# Patient Record
Sex: Female | Born: 1975 | Race: Black or African American | Hispanic: No | Smoking: Current some day smoker
Health system: Southern US, Community
[De-identification: ages and names within clinical notes are randomized; demographics above are authoritative.]

## PROBLEM LIST (undated history)

## (undated) DIAGNOSIS — F419 Anxiety disorder, unspecified: Secondary | ICD-10-CM

## (undated) DIAGNOSIS — F32A Depression, unspecified: Secondary | ICD-10-CM

## (undated) DIAGNOSIS — F329 Major depressive disorder, single episode, unspecified: Secondary | ICD-10-CM

## (undated) DIAGNOSIS — F909 Attention-deficit hyperactivity disorder, unspecified type: Secondary | ICD-10-CM

## (undated) DIAGNOSIS — E119 Type 2 diabetes mellitus without complications: Secondary | ICD-10-CM

## (undated) DIAGNOSIS — J45909 Unspecified asthma, uncomplicated: Secondary | ICD-10-CM

## (undated) DIAGNOSIS — D649 Anemia, unspecified: Secondary | ICD-10-CM

## (undated) DIAGNOSIS — K219 Gastro-esophageal reflux disease without esophagitis: Secondary | ICD-10-CM

## (undated) HISTORY — DX: Attention-deficit hyperactivity disorder, unspecified type: F90.9

## (undated) HISTORY — DX: Depression, unspecified: F32.A

## (undated) HISTORY — PX: DILATION AND CURETTAGE OF UTERUS: SHX78

## (undated) HISTORY — DX: Anemia, unspecified: D64.9

## (undated) HISTORY — DX: Anxiety disorder, unspecified: F41.9

## (undated) HISTORY — DX: Gastro-esophageal reflux disease without esophagitis: K21.9

## (undated) HISTORY — DX: Unspecified asthma, uncomplicated: J45.909

## (undated) HISTORY — DX: Type 2 diabetes mellitus without complications: E11.9

## (undated) HISTORY — DX: Major depressive disorder, single episode, unspecified: F32.9

---

## 2009-10-26 HISTORY — PX: KNEE ARTHROSCOPY: SUR90

## 2010-10-26 HISTORY — PX: GASTRIC BYPASS: SHX52

## 2011-09-25 ENCOUNTER — Emergency Department: Payer: Self-pay | Admitting: Emergency Medicine

## 2013-05-23 ENCOUNTER — Other Ambulatory Visit: Payer: Self-pay | Admitting: Bariatrics

## 2013-05-29 ENCOUNTER — Ambulatory Visit
Admission: RE | Admit: 2013-05-29 | Discharge: 2013-05-29 | Disposition: A | Discharge status: Home / Self Care | Payer: BC Managed Care – PPO | Source: Ambulatory Visit | Attending: Bariatrics | Admitting: Bariatrics

## 2013-06-22 ENCOUNTER — Ambulatory Visit: Payer: Self-pay | Admitting: Bariatrics

## 2013-06-26 ENCOUNTER — Ambulatory Visit: Payer: Self-pay | Admitting: Bariatrics

## 2013-07-26 ENCOUNTER — Ambulatory Visit: Payer: Self-pay | Admitting: Bariatrics

## 2014-07-17 ENCOUNTER — Emergency Department: Payer: Self-pay | Admitting: Emergency Medicine

## 2016-09-01 ENCOUNTER — Ambulatory Visit: Payer: BC Managed Care – PPO | Admitting: Certified Registered Nurse Anesthetist

## 2016-09-01 ENCOUNTER — Encounter
Admission: RE | Disposition: A | Discharge status: Home / Self Care | Payer: Self-pay | Source: Ambulatory Visit | Attending: Bariatrics

## 2016-09-01 ENCOUNTER — Ambulatory Visit
Admission: RE | Admit: 2016-09-01 | Discharge: 2016-09-01 | Disposition: A | Discharge status: Home / Self Care | Payer: BC Managed Care – PPO | Source: Ambulatory Visit | Attending: Bariatrics | Admitting: Bariatrics

## 2016-09-01 DIAGNOSIS — K219 Gastro-esophageal reflux disease without esophagitis: Secondary | ICD-10-CM | POA: Insufficient documentation

## 2016-09-01 DIAGNOSIS — E7800 Pure hypercholesterolemia, unspecified: Secondary | ICD-10-CM | POA: Diagnosis not present

## 2016-09-01 DIAGNOSIS — F419 Anxiety disorder, unspecified: Secondary | ICD-10-CM | POA: Insufficient documentation

## 2016-09-01 DIAGNOSIS — Z9049 Acquired absence of other specified parts of digestive tract: Secondary | ICD-10-CM | POA: Insufficient documentation

## 2016-09-01 DIAGNOSIS — E119 Type 2 diabetes mellitus without complications: Secondary | ICD-10-CM | POA: Insufficient documentation

## 2016-09-01 DIAGNOSIS — Z9884 Bariatric surgery status: Secondary | ICD-10-CM | POA: Diagnosis not present

## 2016-09-01 DIAGNOSIS — R1011 Right upper quadrant pain: Secondary | ICD-10-CM | POA: Insufficient documentation

## 2016-09-01 DIAGNOSIS — Z79899 Other long term (current) drug therapy: Secondary | ICD-10-CM | POA: Insufficient documentation

## 2016-09-01 DIAGNOSIS — R197 Diarrhea, unspecified: Secondary | ICD-10-CM | POA: Insufficient documentation

## 2016-09-01 DIAGNOSIS — Z87891 Personal history of nicotine dependence: Secondary | ICD-10-CM | POA: Insufficient documentation

## 2016-09-01 DIAGNOSIS — R1013 Epigastric pain: Secondary | ICD-10-CM | POA: Insufficient documentation

## 2016-09-01 DIAGNOSIS — G4730 Sleep apnea, unspecified: Secondary | ICD-10-CM | POA: Diagnosis not present

## 2016-09-01 DIAGNOSIS — F329 Major depressive disorder, single episode, unspecified: Secondary | ICD-10-CM | POA: Diagnosis not present

## 2016-09-01 DIAGNOSIS — Z6841 Body Mass Index (BMI) 40.0 and over, adult: Secondary | ICD-10-CM | POA: Insufficient documentation

## 2016-09-01 HISTORY — PX: ESOPHAGOGASTRODUODENOSCOPY (EGD) WITH PROPOFOL: SHX5813

## 2016-09-01 LAB — POCT PREGNANCY, URINE: Preg Test, Ur: NEGATIVE

## 2016-09-01 SURGERY — ESOPHAGOGASTRODUODENOSCOPY (EGD) WITH PROPOFOL
Anesthesia: General

## 2016-09-01 MED ORDER — FENTANYL CITRATE (PF) 100 MCG/2ML IJ SOLN
INTRAMUSCULAR | Status: DC | PRN
  Administered 2016-09-01: 50 ug via INTRAVENOUS

## 2016-09-01 MED ORDER — OMEPRAZOLE 20 MG PO CPDR: 20.0000 mg | DELAYED_RELEASE_CAPSULE | Freq: Every day | ORAL | 1 refills | Status: DC

## 2016-09-01 MED ORDER — SODIUM CHLORIDE 0.9 % IV SOLN
INTRAVENOUS | Status: DC
  Administered 2016-09-01: 13:00:00 via INTRAVENOUS

## 2016-09-01 MED ORDER — IPRATROPIUM-ALBUTEROL 0.5-2.5 (3) MG/3ML IN SOLN
3.0000 mL | Freq: Four times a day (QID) | RESPIRATORY_TRACT | Status: DC
  Administered 2016-09-01: 3 mL via RESPIRATORY_TRACT
  Filled 2016-09-01: qty 3

## 2016-09-01 MED ORDER — PROPOFOL 500 MG/50ML IV EMUL
INTRAVENOUS | Status: DC | PRN
  Administered 2016-09-01: 150 ug/kg/min via INTRAVENOUS

## 2016-09-01 MED ORDER — LIDOCAINE HCL (CARDIAC) 20 MG/ML IV SOLN
INTRAVENOUS | Status: DC | PRN
  Administered 2016-09-01: 100 mg via INTRAVENOUS

## 2016-09-01 MED ORDER — PROPOFOL 10 MG/ML IV BOLUS
INTRAVENOUS | Status: DC | PRN
Start: 2016-09-01 — End: 2016-09-01
  Administered 2016-09-01: 80 mg via INTRAVENOUS
  Administered 2016-09-01 (×4): 20 mg via INTRAVENOUS

## 2016-09-01 MED ORDER — IPRATROPIUM-ALBUTEROL 0.5-2.5 (3) MG/3ML IN SOLN: 3.0000 mL | Freq: Four times a day (QID) | RESPIRATORY_TRACT | Status: DC

## 2016-09-01 MED ORDER — IPRATROPIUM-ALBUTEROL 0.5-2.5 (3) MG/3ML IN SOLN
RESPIRATORY_TRACT | Status: AC
Start: 2016-09-01 — End: 2016-09-01
  Administered 2016-09-01: 15:00:00 via RESPIRATORY_TRACT
  Filled 2016-09-01: qty 3

## 2016-09-01 NOTE — Transfer of Care (Signed)
Immediate Anesthesia Transfer of Care Note  Patient: [name] Potts  Procedure(s) Performed: Procedure(s): ESOPHAGOGASTRODUODENOSCOPY (EGD) WITH PROPOFOL (N/A)  Patient Location: PACU  Anesthesia Type:General  Level of Consciousness: awake, alert  and oriented  Airway & Oxygen Therapy: Patient Spontanous Breathing and Patient connected to face mask oxygen  Post-op Assessment: Report given to RN and Post -op Vital signs reviewed and stable  Post vital signs: Reviewed and stable  Last Vitals:  Vitals:   09/01/16 1304  BP: (!) 149/99  Pulse: 72  Resp: 17  Temp: 36.1 C    Last Pain:  Vitals:   09/01/16 1304  TempSrc: Tympanic         Complications: No apparent anesthesia complications

## 2016-09-01 NOTE — Anesthesia Postprocedure Evaluation (Signed)
Anesthesia Post Note  Patient: [name] Potts  Procedure(s) Performed: Procedure(s) (LRB): ESOPHAGOGASTRODUODENOSCOPY (EGD) WITH PROPOFOL (N/A)  Patient location during evaluation: PACU Anesthesia Type: General Level of consciousness: awake Pain management: pain level controlled Vital Signs Assessment: post-procedure vital signs reviewed and stable Respiratory status: spontaneous breathing Cardiovascular status: stable Anesthetic complications: no    Last Vitals:  Vitals:   09/01/16 1400 09/01/16 1410  BP: 127/89 (!) 136/92  Pulse:  65  Resp:    Temp:      Last Pain:  Vitals:   09/01/16 1345  TempSrc: Oral                 VAN STAVEREN,Cerina Leary

## 2016-09-01 NOTE — Interval H&P Note (Signed)
History and Physical Interval Note:  09/01/2016 1:08 PM  [name] Potts  has presented today for surgery, with the diagnosis of GASTRIC REFLUX DISEASE  The various methods of treatment have been discussed with the patient and family. After consideration of risks, benefits and other options for treatment, the patient has consented to  Procedure(s): ESOPHAGOGASTRODUODENOSCOPY (EGD) WITH PROPOFOL (N/A) as a surgical intervention .  The patient's history has been reviewed, patient examined, no change in status, stable for surgery.  I have reviewed the patient's chart and labs.  Questions were answered to the patient's satisfaction.     Everette Rankyner, Laurelyn Terrero A

## 2016-09-01 NOTE — H&P (Signed)
Print     Patient   Name  [name] Potts, [name] (64YQ(40yo, F) ID# [id number]  Appt. Date/Time  08/19/2016 11:40AM   DOB  [birth year]  Service Dept.  [facility]   Provider  [name] SandyKAYLA CHECKOVICH, PA   Insurance  Med Primary: [employer] Insurance # : [id number] Policy/Group # : [id number]  Prescription: CMX - Member is eligible. details   Chief Complaint  EGD Admin Visit  Patient's Care Team  Primary Care Provider: Craig StaggersSABITHA VASIREDDY MD: [address], [place], [place] [id number], Ph [phone], Fax [phone] NPI: [id number] Patient's Pharmacies  [facility] [phone] [address], [place] [phone], Ph [phone], Fax [phone] Vitals  08/19/2016 12:08 pm Ht:  5 ft 2 in  Wt:  278 lbs   BMI:  50.8   BP:  138/88 sitting L wrist  Pulse:  69 bpm      Allergies   Reviewed Allergies   NKDA   Medications   Reviewed Medications       cholecalciferol (vitamin D3)  04/12/13 entered  [name] Shyyner Camilo Mander, MD   colestipol 1 gram tablet  TAKE 2 TABLET(S) TWICE A DAY BY ORAL ROUTE AS NEEDED FOR 15 DAYS.  07/29/16 filled  surescripts   Naprelan CR 750 mg tab,extended release 24 hr mphase  10/27/13 filled  [facility]   omeprazole 20 mg capsule,delayed release  TAKE 1 CAPSULE (20 MG) BY ORAL ROUTE ONCE DAILY 30 MINUTES TO 1 HOUR BEFORE A MEAL  06/15/13 filled  [facility]   omeprazole 20 mg tablet,delayed release  Take 1 tablet(s) twice a day by oral route as directed.  03/28/14 prescribed  [name] Shyyner Dilyn Osoria, MD   phentermine 37.5 mg tablet  TAKE 1 TABLET(S) EVERY DAY BY ORAL ROUTE AS DIRECTED.  07/29/16 filled  surescripts   traZODone 100 mg tablet  03/22/14 filled  [facility]   traZODone 50 mg tablet  05/25/13 filled  [facility]   valACYclovir 1 gram tablet  03/19/14 filled  [facility]   valACYclovir 500 mg tablet  05/11/13 filled  [facility]   Zofran ODT 4 mg disintegrating tablet  Take 2 tablet(s) every 12 hours by oral route as needed.  03/28/14 prescribed  [name] Shyyner Yonatan Guitron, MD   Problems  Reviewed  Problems Morbid obesity  Gastroesophageal reflux disease  Diarrhea  Right upper quadrant pain  History of bariatric surgical procedure - Onset: 07/29/2016  Family History  Reviewed Family History Mother  - Arthritis    - Hypercholesterolemia    - Diabetes mellitus    - Hypertensive disorder    - Malignant neoplastic disease   Father  - Heart disease    - Reflux    - Diabetes mellitus    - Hypertensive disorder    - Cerebrovascular accident   Social History  Reviewed Social History General Surgery/Bariatric Smoking Status: Former smoker (Notes: Noted 08/19/16 visit:No longer smoking) Chewing Tobacco Use: N Illicit drugs: no Caffeine: Y Alcohol: Y (Notes: Noted 1025/17 visit:a beer a day) Exercise: Y  Surgical History  Surgical History not reviewed (last reviewed 07/29/2016) Other - 02/07/2014 - EGD with dil  Cholecystectomy - 01/25/2011  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 - [facility] [place]  Knee Surgery - 2011  GYN History  (not configured)  Past Medical History  Reviewed Past Medical History Abdominal Pain: Y Anemia: Y Anxiety Disorder: Y Asthma: Y Blood Clots: Y Blood in Stool: Y Breast Discharge: Y Chest Pain/Angina: Y Constipation: Y Cough: Y Depression: Y Diabetes: Y Diarrhea: Y Dizziness: Y Eating Disorder:  Y Excessive Thirst: Y Fatigue: Y Fever: Y Frequent Urination: Y Gall Stones: Y Headaches: Y Heartburn: Y High Cholesterol: Y Joint Pain: Y Joint Stiffness: Y Muscle Weakness: Y Nausea/ Vomiting: Y Nervousness: Y Nosebleeds: Y Painful Bowel Movements: Y Pneumonia: Y Poor Appetite: Y Rectal Bleeding: Y Ringing in Ears: Y Sleep apnea: Y Snoring: Y Sore Throat: Y Spitting up Blood: Y Swelling of Legs/Feet/Hands: Y Swollen Glands: Y Weight Gain: Y Weight loss: Y Notes: gerd  Screening  None recorded.  HPI  Pt presents today for administrative visit for EGD on 11/7. States that there have been no changes to her medical  history or medications. Does take Ibuprofen for menstrual relief which she is supposed to start on 10/28 and end on 11/1. Denies the use of aspirin, Vitamin E, fish oil and blood thinners. No complaints or concerns at this time.  ROS   Constitutional: Constitutional: no significant weight gain or loss, no fever, and (normal) chills.  Eyes: Eyes: no irritation, dry eyes, or vision change and (normal) blurred vision and seeing double (diplopia).  ENMT: Ears: no difficulty hearing or ear pain and (normal) ringing in the ears (tinnitus). Nose: no frequent nosebleeds or nose/sinus problems. Mouth/Throat: no sore throat or mouth ulcers and (normal) swollen glands.  Cardiovascular: Cardiovascular: no palpitations or chest pain and (normal) limb swelling.  Respiratory: Respiratory: no cough, wheezing, shortness of breath, or coughing up blood.  Gastrointestinal: Gastrointestinal: vomiting [name] Potts(/Nausea) and frequent diarrhea and no abdominal pain, not vomiting blood, and (normal) color: red blood in bowel movement (hematochezia); Constipation.  Genitourinary: Genitourinary: no incontinence, hematuria, difficulty urinating, or increased frequency.  Musculoskeletal: Musculoskeletal: no muscle aches or weakness and arthralgias/joint pain; Joint Swelling.  Integumentary: Skin: no jaundice, rashes, or abnormal mole and (normal) breast pain.  Neurologic: Neurologic: no weakness, numbness, seizures, dizziness, headaches, or loss of consciousness.  Psychiatric: Psych: no depression or sleep disturbances.  Endocrine: Endocrine: no fatigue and (normal) dry skin.  Hematologic/Lymphatic: Hematologic/Lymphatic no bruising or blood clot.  Allergic/Immunologic: Allergy/Immunologic: no itching, hives, runny nose, sinus pressure, or frequent sneezing.  Physical Exam   Patient is a 40 year old female.  Constitutional: Level of Distress: NAD. Ambulation: ambulating normally.  Psychiatric: Insight: good judgement. Mental  Status: normal mood and affect and active and alert. Orientation: to time, place, and person. Memory: recent memory normal and remote memory normal.  Head: Head: normocephalic and atraumatic.  Eyes: Lids and Conjunctivae: no discharge or pallor and non-injected. EOM: EOMI. Sclerae: non-icteric.  Neck: Neck: supple, FROM, trachea midline, and no masses. Lymph Nodes: no cervical LAD or supraclavicular LAD.  Lungs: Respiratory effort: no dyspnea. Auscultation: no wheezing, rales/crackles, or rhonchi and breath sounds normal, good air movement, and CTA except as noted.  Cardiovascular: Heart Auscultation: normal S1 and S2; no murmurs, rubs, or gallops; and RRR. Neck vessels: no carotid bruits. Pulses including femoral / pedal: normal throughout.  Abdomen: Inspection and Palpation: no tenderness, guarding, masses, or rebound tenderness and soft and non-distended.  Musculoskeletal:: Motor Strength and Tone: normal tone and motor strength. Joints, Bones, and Muscles: normal movement of all extremities. Extremities: no cyanosis or edema.  Neurologic: Gait and Station: normal gait and station. Cranial Nerves: grossly intact.  Skin: Inspection and palpation: good turgor and no jaundice. Nails: normal.  Procedure Documentation  None recorded.  Assessment / Plan   ASSESSMENT: 1. Administrative visit for EGD on 11/7 PLAN:  1. Stop taking NSAIDs after 10/31. Do not take aspirin, Vitamin E, fish oil or blood thinners  after 10/31 as well. 2. Remain NPO after midnight on 11/6  3. May take Tylenol for pain  4. If there are any changes to your medical history or medications, please call and update the office 5. F/U as scheduled after procedure

## 2016-09-01 NOTE — Anesthesia Preprocedure Evaluation (Signed)
Anesthesia Evaluation  Patient identified by MRN, date of birth, ID band Patient awake    Reviewed: Allergy & Precautions, NPO status , Patient's Chart, lab work & pertinent test results  Airway Mallampati: II       Dental  (+) Teeth Intact   Pulmonary neg pulmonary ROS,     + decreased breath sounds      Cardiovascular Exercise Tolerance: Good  Rhythm:Regular Rate:Normal     Neuro/Psych negative neurological ROS     GI/Hepatic negative GI ROS, Neg liver ROS,   Endo/Other  Morbid obesity  Renal/GU negative Renal ROS     Musculoskeletal   Abdominal (+) + obese,   Peds negative pediatric ROS (+)  Hematology negative hematology ROS (+)   Anesthesia Other Findings   Reproductive/Obstetrics                             Anesthesia Physical Anesthesia Plan  ASA: III  Anesthesia Plan: General   Post-op Pain Management:    Induction: Intravenous  Airway Management Planned: Natural Airway and Nasal Cannula  Additional Equipment:   Intra-op Plan:   Post-operative Plan:   Informed Consent: I have reviewed the patients History and Physical, chart, labs and discussed the procedure including the risks, benefits and alternatives for the proposed anesthesia with the patient or authorized representative who has indicated his/her understanding and acceptance.     Plan Discussed with: CRNA  Anesthesia Plan Comments:         Anesthesia Quick Evaluation

## 2016-09-01 NOTE — Op Note (Addendum)
[facility] Gastroenterology Patient Name: [name] RocherGabrielle Pendleton Procedure Date: 09/01/2016 1:21 PM MRN: [id number] Account #: [id number] Date of Birth: 1975/12/01 Admit Type: Outpatient Age: 40 [facility]OM 4 Gender: Female Note Status: Finalized Procedure:            Upper GI endoscopy Indications:          Epigastric abdominal pain, Abdominal pain in the right                        upper quadrant Providers:            Tyrone AppleMichael A. Jakelin Taussig Referring MD:         No Local Md, MD (Referring MD) Medicines:            Sedation Required Anesthesia Staff Assistance Complications:        No immediate complications. Procedure:            Pre-Anesthesia Assessment:                       - The anesthesia plan was to use moderate                        sedation/analgesia (conscious sedation).                       After obtaining informed consent, the endoscope was                        passed under direct vision. Throughout the procedure,                        the patient's blood pressure, pulse, and oxygen                        saturations were monitored continuously. The upper GI                        endoscopy was accomplished without difficulty. The                        patient tolerated the procedure well. The Endoscope was                        introduced through the mouth, and advanced to the                        second part of duodenum. Findings:      The ampulla, duodenal bulb, first portion of the duodenum and second       portion of the duodenum were normal.      tubular nature of sleeve gastric stapling. mild to moderate angulation       at incisura requiring some active turning of scope. A TTS dilator was       passed through the scope. Dilation with an 18-19-20 mm balloon dilator       was performed to 20 mm. + pooling of bile in stomach but only trace       gastritis.      The examined esophagus was normal. no Hiatal hernia noted. Impression:            - Normal ampulla, duodenal bulb, first  portion of the                        duodenum and second portion of the duodenum.                       - Normal esophagus.                       - No specimens collected. Recommendation:       - Return to my office in 2 weeks. Procedure Code(s):    --- Professional ---                       [phone], Esophagogastroduodenoscopy, flexible, transoral;                        with dilation of gastric/duodenal stricture(s) (eg,                        balloon, bougie) Diagnosis Code(s):    --- Professional ---                       R10.13, Epigastric pain                       R10.11, Right upper quadrant pain CPT copyright 2016 American Medical Association. All rights reserved. The codes documented in this report are preliminary and upon coder review may  be revised to meet current compliance requirements. Effie ShyMichael Kizzie Cotten, MD Everette RankMichael A Jeffifer Rabold,  09/01/2016 2:19:33 PM This report has been signed electronically. Number of Addenda: 0 Note Initiated On: 09/01/2016 1:21 PM      [facility]

## 2016-09-01 NOTE — Brief Op Note (Signed)
09/01/2016  2:20 PM  PATIENT:  [name] Potts  40 y.o. female  PRE-OPERATIVE DIAGNOSIS:  GASTRIC REFLUX DISEASE  POST-OPERATIVE DIAGNOSIS:  mild to moderate acute angulation at area of the incisura, mild bile pooling in stomach, no evidence of esophagitis and normal proximal duodenum  PROCEDURE:  Procedure(s): ESOPHAGOGASTRODUODENOSCOPY (EGD) WITH PROPOFOL (N/A)WITH BALLOON DILATION  SURGEON:  Surgeon(s) and Role:    * Everette RankMichael A Jenah Vanasten, MD - Primary      ANESTHESIA:   MAC  EBL:  Total I/O In: 300 [I.V.:300] Out: -   BLOOD ADMINISTERED:none  DRAINS: none   LOCAL MEDICATIONS USED:  NONE  SPECIMEN:  No Specimen  DISPOSITION OF SPECIMEN:  N/A  COUNTS:  NO None required  TOURNIQUET:  * No tourniquets in log *  DICTATION: .Dragon Dictation  PLAN OF CARE: Discharge to home after PACU  PATIENT DISPOSITION:  Home   Delay start of Pharmacological VTE agent (>24hrs) due to surgical blood loss or risk of bleeding: no

## 2016-09-02 ENCOUNTER — Encounter: Payer: Self-pay | Admitting: Bariatrics

## 2018-01-03 ENCOUNTER — Other Ambulatory Visit: Payer: Self-pay

## 2018-01-03 DIAGNOSIS — F17200 Nicotine dependence, unspecified, uncomplicated: Secondary | ICD-10-CM | POA: Diagnosis not present

## 2018-01-03 DIAGNOSIS — Z79899 Other long term (current) drug therapy: Secondary | ICD-10-CM | POA: Diagnosis not present

## 2018-01-03 DIAGNOSIS — R1010 Upper abdominal pain, unspecified: Secondary | ICD-10-CM | POA: Insufficient documentation

## 2018-01-03 DIAGNOSIS — R1110 Vomiting, unspecified: Secondary | ICD-10-CM | POA: Diagnosis not present

## 2018-01-03 NOTE — ED Triage Notes (Signed)
Pt presents to ED via POV from home with c/o RIGHT side flank pain x2 months with increased intensity over the last 1-2 days. Pt reports increase in urinary frequency, and reports 2 episodes of emesis over the last 24 hrs.

## 2018-01-04 ENCOUNTER — Emergency Department
Admission: EM | Admit: 2018-01-04 | Discharge: 2018-01-04 | Disposition: A | Discharge status: Home / Self Care | Payer: 59 | Attending: Emergency Medicine | Admitting: Emergency Medicine

## 2018-01-04 ENCOUNTER — Emergency Department: Payer: 59

## 2018-01-04 DIAGNOSIS — R109 Unspecified abdominal pain: Secondary | ICD-10-CM

## 2018-01-04 LAB — CBC
HCT: 36.9 % (ref 35.0–47.0)
Hemoglobin: 11.9 g/dL — ABNORMAL LOW (ref 12.0–16.0)
MCH: 28.5 pg (ref 26.0–34.0)
MCHC: 32.3 g/dL (ref 32.0–36.0)
MCV: 88.5 fL (ref 80.0–100.0)
PLATELETS: 250 10*3/uL (ref 150–440)
RBC: 4.17 MIL/uL (ref 3.80–5.20)
RDW: 13.4 % (ref 11.5–14.5)
WBC: 8.8 10*3/uL (ref 3.6–11.0)

## 2018-01-04 LAB — URINALYSIS, COMPLETE (UACMP) WITH MICROSCOPIC
Bacteria, UA: NONE SEEN
Bilirubin Urine: NEGATIVE
Glucose, UA: NEGATIVE mg/dL
Hgb urine dipstick: NEGATIVE
Ketones, ur: NEGATIVE mg/dL
LEUKOCYTES UA: NEGATIVE
Nitrite: NEGATIVE
PROTEIN: NEGATIVE mg/dL
Specific Gravity, Urine: 1.004 — ABNORMAL LOW (ref 1.005–1.030)
pH: 6 (ref 5.0–8.0)

## 2018-01-04 LAB — COMPREHENSIVE METABOLIC PANEL
ALT: 33 U/L (ref 14–54)
AST: 78 U/L — ABNORMAL HIGH (ref 15–41)
Albumin: 3.8 g/dL (ref 3.5–5.0)
Alkaline Phosphatase: 73 U/L (ref 38–126)
Anion gap: 10 (ref 5–15)
BUN: 12 mg/dL (ref 6–20)
CHLORIDE: 105 mmol/L (ref 101–111)
CO2: 21 mmol/L — ABNORMAL LOW (ref 22–32)
CREATININE: 0.9 mg/dL (ref 0.44–1.00)
Calcium: 8.9 mg/dL (ref 8.9–10.3)
Glucose, Bld: 99 mg/dL (ref 65–99)
POTASSIUM: 3.6 mmol/L (ref 3.5–5.1)
Sodium: 136 mmol/L (ref 135–145)
Total Bilirubin: 1 mg/dL (ref 0.3–1.2)
Total Protein: 7.9 g/dL (ref 6.5–8.1)

## 2018-01-04 LAB — LIPASE, BLOOD: LIPASE: 46 U/L (ref 11–51)

## 2018-01-04 LAB — PREGNANCY, URINE: PREG TEST UR: NEGATIVE

## 2018-01-04 MED ORDER — ONDANSETRON HCL 4 MG/2ML IJ SOLN
4.0000 mg | Freq: Once | INTRAMUSCULAR | Status: AC
  Administered 2018-01-04: 4 mg via INTRAVENOUS
  Filled 2018-01-04: qty 2

## 2018-01-04 MED ORDER — IOPAMIDOL (ISOVUE-300) INJECTION 61%
125.0000 mL | Freq: Once | INTRAVENOUS | Status: AC | PRN
  Administered 2018-01-04: 125 mL via INTRAVENOUS

## 2018-01-04 MED ORDER — IOPAMIDOL (ISOVUE-300) INJECTION 61%
30.0000 mL | Freq: Once | INTRAVENOUS | Status: AC
  Administered 2018-01-04: 30 mL via ORAL

## 2018-01-04 MED ORDER — OXYCODONE-ACETAMINOPHEN 7.5-325 MG PO TABS: 1.0000 | ORAL_TABLET | ORAL | 0 refills | Status: AC | PRN

## 2018-01-04 MED ORDER — MORPHINE SULFATE (PF) 4 MG/ML IV SOLN
4.0000 mg | Freq: Once | INTRAVENOUS | Status: AC
  Administered 2018-01-04: 4 mg via INTRAVENOUS
  Filled 2018-01-04: qty 1

## 2018-01-04 MED ORDER — MORPHINE SULFATE (PF) 4 MG/ML IV SOLN
4.0000 mg | Freq: Once | INTRAVENOUS | Status: AC
Start: 2018-01-04 — End: 2018-01-04
  Administered 2018-01-04: 4 mg via INTRAVENOUS

## 2018-01-04 MED ORDER — HYDROMORPHONE HCL 1 MG/ML IJ SOLN
1.0000 mg | Freq: Once | INTRAMUSCULAR | Status: AC
  Administered 2018-01-04: 1 mg via INTRAVENOUS
  Filled 2018-01-04: qty 1

## 2018-01-04 MED ORDER — MORPHINE SULFATE (PF) 4 MG/ML IV SOLN
INTRAVENOUS | Status: AC
  Filled 2018-01-04: qty 1

## 2018-01-04 NOTE — ED Provider Notes (Signed)
Essentially normal CT scan without any specific etiology for her flank pain.  Labs and workup are unremarkable.  I will discharge her with pain medicine and advise close outpatient follow-up with her gastric bypass surgeon.   [name] Potts, [name] Potts E, MD 01/04/18 [phone]

## 2018-01-04 NOTE — ED Notes (Signed)
Pt ambulated to the bathroom and returned to her bed without difficulty.  

## 2018-01-04 NOTE — ED Provider Notes (Signed)
[facility] Emergency Department Provider Note    First MD Initiated Contact with Patient 01/04/18 0423     (approximate)  I have reviewed the triage vital signs and the nursing notes.   HISTORY  Chief Complaint Flank Pain    HPI [name] Potts is a 42 y.o. female presents to the emergency department with a 39-month history of upper abdominal pain with increasing intensity over the last 2 days.  Patient also admits to 2 episodes of emesis in the last 24 hours.  Patient denies any diarrhea no constipation.  Patient denies any fever.  Patient denies any urinary symptoms.  Patient states her current pain score 7.5-8 of 10   Past medical history Gastric sleeve  There are no active problems to display for this patient.   Past Surgical History:  Procedure Laterality Date  . ESOPHAGOGASTRODUODENOSCOPY (EGD) WITH PROPOFOL N/A 09/01/2016   Procedure: ESOPHAGOGASTRODUODENOSCOPY (EGD) WITH PROPOFOL;  Surgeon: Everette Rank, MD;  Location: [facility] ENDOSCOPY;  Service: General;  Laterality: N/A;    Prior to Admission medications   Medication Sig Start Date End Date Taking? Authorizing Provider  trazodone (DESYREL) 300 MG tablet Take 300 mg by mouth at bedtime.   Yes [provider]  omeprazole (PRILOSEC) 20 MG capsule Take 1 capsule (20 mg total) by mouth daily. Patient not taking: Reported on 01/04/2018 09/01/16   Everette Rank, MD  oxyCODONE-acetaminophen (PERCOCET) 7.5-325 MG tablet Take 1 tablet by mouth every 4 (four) hours as needed for severe pain. 01/04/18 01/04/19  Emily Filbert, MD    Allergies No known drug allergies No family history on file.  Social History Social History   Tobacco Use  . Smoking status: Current Every Day Smoker  . Smokeless tobacco: Never Used  Substance Use Topics  . Alcohol use: Not on file  . Drug use: Not on file    Review of Systems Constitutional: No fever/chills Eyes: No visual  changes. ENT: No sore throat. Cardiovascular: Denies chest pain. Respiratory: Denies shortness of breath. Gastrointestinal: Positive for abdominal pain nausea and vomiting no diarrhea.  No constipation. Genitourinary: Negative for dysuria. Musculoskeletal: Negative for neck pain.  Negative for back pain. Integumentary: Negative for rash. Neurological: Negative for headaches, focal weakness or numbness.   ____________________________________________   PHYSICAL EXAM:  VITAL SIGNS: ED Triage Vitals  Enc Vitals Group     BP 01/03/18 2329 (!) 165/95     Pulse Rate 01/03/18 2329 67     Resp 01/03/18 2329 18     Temp 01/03/18 2329 97.8 F (36.6 C)     Temp Source 01/03/18 2329 Oral     SpO2 01/03/18 2329 100 %     Weight 01/03/18 2330 126.1 kg (278 lb)     Height 01/03/18 2330 1.651 m (5\' 5" )     Head Circumference --      Peak Flow --      Pain Score 01/03/18 2330 10     Pain Loc --      Pain Edu? --      Excl. in GC? --     Constitutional: Alert and oriented. Well appearing and in no acute distress. Eyes: Conjunctivae are normal.  Head: Atraumatic. Mouth/Throat: Mucous membranes are moist. Oropharynx non-erythematous. Neck: No stridor.   Cardiovascular: Normal rate, regular rhythm. Good peripheral circulation. Grossly normal heart sounds. Respiratory: Normal respiratory effort.  No retractions. Lungs CTAB. Gastrointestinal: Generalized abdominal tenderness to palpation.. No distention.  Musculoskeletal: No lower  extremity tenderness nor edema. No gross deformities of extremities. Neurologic:  Normal speech and language. No gross focal neurologic deficits are appreciated.  Skin:  Skin is warm, dry and intact. No rash noted.   ____________________________________________   LABS (all labs ordered are listed, but only abnormal results are displayed)  Labs Reviewed  URINALYSIS, COMPLETE (UACMP) WITH MICROSCOPIC - Abnormal; Notable for the following components:       Result Value   Color, Urine STRAW (*)    APPearance CLEAR (*)    Specific Gravity, Urine 1.004 (*)    Squamous Epithelial / LPF 0-5 (*)    All other components within normal limits  CBC - Abnormal; Notable for the following components:   Hemoglobin 11.9 (*)    All other components within normal limits  COMPREHENSIVE METABOLIC PANEL - Abnormal; Notable for the following components:   CO2 21 (*)    AST 78 (*)    All other components within normal limits  LIPASE, BLOOD  PREGNANCY, URINE  POC URINE PREG, ED      Procedures   ____________________________________________   INITIAL IMPRESSION / ASSESSMENT AND PLAN / ED COURSE  As part of my medical decision making, I reviewed the following data within the electronic MEDICAL RECORD NUMBER   42 year old female presented with above-stated history and physical exam of generalized abdominal pain with history of gastric sleeve.  CT scan of the abdomen ordered and is pending at this time.  Patient given IV morphine multiple doses with minimal improvement of pain.  Patient's care transferred to Dr. Mayford KnifeWilliams ____________________________________________  FINAL CLINICAL IMPRESSION(S) / ED DIAGNOSES  Final diagnoses:  Flank pain     MEDICATIONS GIVEN DURING THIS VISIT:  Medications  iopamidol (ISOVUE-300) 61 % injection 30 mL (30 mLs Oral Contrast Given 01/04/18 0443)  morphine 4 MG/ML injection 4 mg (4 mg Intravenous Given 01/04/18 0454)  ondansetron (ZOFRAN) injection 4 mg (4 mg Intravenous Given 01/04/18 0453)  morphine 4 MG/ML injection 4 mg (4 mg Intravenous Given 01/04/18 0601)  HYDROmorphone (DILAUDID) injection 1 mg (1 mg Intravenous Given 01/04/18 0749)  iopamidol (ISOVUE-300) 61 % injection 125 mL (125 mLs Intravenous Contrast Given 01/04/18 0726)     ED Discharge Orders        Ordered    oxyCODONE-acetaminophen (PERCOCET) 7.5-325 MG tablet  Every 4 hours PRN     01/04/18 [id number]       Note:  This document was prepared using  Dragon voice recognition software and may include unintentional dictation errors.    Darci CurrentBrown, Fife N, MD 01/05/18 [phone]

## 2019-01-03 ENCOUNTER — Other Ambulatory Visit: Payer: Self-pay

## 2019-01-03 ENCOUNTER — Ambulatory Visit
Admission: RE | Admit: 2019-01-03 | Discharge: 2019-01-03 | Disposition: A | Discharge status: Home / Self Care | Payer: BLUE CROSS/BLUE SHIELD | Source: Ambulatory Visit | Attending: Nurse Practitioner | Admitting: Nurse Practitioner

## 2019-01-03 ENCOUNTER — Ambulatory Visit (INDEPENDENT_AMBULATORY_CARE_PROVIDER_SITE_OTHER): Payer: BLUE CROSS/BLUE SHIELD | Admitting: Nurse Practitioner

## 2019-01-03 ENCOUNTER — Encounter: Payer: Self-pay | Admitting: Nurse Practitioner

## 2019-01-03 VITALS — BP 136/82 | HR 77 | Temp 98.0°F | Ht 65.0 in | Wt 306.6 lb

## 2019-01-03 DIAGNOSIS — K219 Gastro-esophageal reflux disease without esophagitis: Secondary | ICD-10-CM | POA: Diagnosis not present

## 2019-01-03 DIAGNOSIS — Z7689 Persons encountering health services in other specified circumstances: Secondary | ICD-10-CM

## 2019-01-03 DIAGNOSIS — R05 Cough: Secondary | ICD-10-CM | POA: Insufficient documentation

## 2019-01-03 DIAGNOSIS — J4521 Mild intermittent asthma with (acute) exacerbation: Secondary | ICD-10-CM | POA: Insufficient documentation

## 2019-01-03 DIAGNOSIS — R509 Fever, unspecified: Secondary | ICD-10-CM

## 2019-01-03 DIAGNOSIS — R059 Cough, unspecified: Secondary | ICD-10-CM

## 2019-01-03 MED ORDER — OMEPRAZOLE 40 MG PO CPDR: 40.0000 mg | DELAYED_RELEASE_CAPSULE | Freq: Two times a day (BID) | ORAL | 5 refills | Status: DC

## 2019-01-03 MED ORDER — BENZONATATE 100 MG PO CAPS: 100.0000 mg | ORAL_CAPSULE | Freq: Two times a day (BID) | ORAL | 0 refills | Status: DC | PRN

## 2019-01-03 MED ORDER — PREDNISONE 50 MG PO TABS: 50.0000 mg | ORAL_TABLET | Freq: Every day | ORAL | 0 refills | Status: AC

## 2019-01-03 NOTE — Patient Instructions (Addendum)
[name] Potts,   Thank you for coming in to clinic today.  1. You have an asthma exacerbation after flu.  - I am also concerned about possible pneumonia with your recent fevers.  Xray today. - Continue albuterol up to every 4 hours as needed for shortness of breath and wheezing. - Cough - continue mucinex and Robitussin.  May also start benzonatate 100 mg capsules twice daily as needed for cough   2. For reflux, increase omeprazole to 40 mg twice daily.  Continue follow-up with bariatric surgery.    3. For anxiety/depression - return for reassessment in about 4 weeks.   Please schedule a follow-up appointment with Wilhelmina Mcardle, AGNP. Return in about 4 weeks (around 01/31/2019) for anxiety, depression.  If you have any other questions or concerns, please feel free to call the clinic or send a message through MyChart. You may also schedule an earlier appointment if necessary.  You will receive a survey after today's visit either digitally by e-mail or paper by [employer]. Your experiences and feedback matter to [place].  Please respond so we know how we are doing as we provide care for you.   Wilhelmina Mcardle, DNP, AGNP-BC Adult Gerontology Nurse Practitioner [facility], [facility]

## 2019-01-03 NOTE — Progress Notes (Signed)
Subjective:    Patient ID: [name] Potts, female    DOB: Apr 27, 1976, 43 y.o.   MRN: [id number]  [name] Potts is a 43 y.o. female presenting on 01/03/2019 for Establish Care (persistent cough since recent possible flu x 3.5 weeks ago, pt also complains of fatigue w/ fever spikes )   HPI Establish Care New Provider Pt last seen by PCP [facility] about 2 years ago.  Obtain records from [facility]. - Patient has also been patient of bariatrics with [facility], transitioned recently to Dr. Alva Garnet with [facility].  URI Patient presents with likely flu about 3.5 weeks ago (never diagnosed formally or treated).  Patient presents now with intermittent, recurrent fever - Uses albuterol currently 3-4 times daily - due to breathing difficulty. - Patient had positive flu exposure. - Communicated at funeral for her father. - OTC meds for symptoms and is currently taking Mucinex and Robitussin (day and night formulation) - helps some, but continues to have coughing fits with difficulty breathing. - Is expectorating mucous but cannot get it spit out.    GERD Last barium swallow 1 year ago - Bariatric surgery group recommended omeprazole 60 mg daily total dose and continues to have breakthrough heartburn.  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  Patient was having workup ongoing with Dr. Smitty Cords at [facility].    Depression screen PHQ 2/9 01/03/2019  Decreased Interest 1  Down, Depressed, Hopeless 1  PHQ - 2 Score 2  Altered sleeping 3  Tired, decreased energy 3  Change in appetite 1  Feeling bad or failure about yourself  1  Trouble concentrating 3  Moving slowly or fidgety/restless 0  Suicidal thoughts 0  PHQ-9 Score 13  Difficult doing work/chores Somewhat difficult    GAD 7 : Generalized Anxiety Score 01/03/2019  Nervous, Anxious, on Edge 3  Control/stop worrying 1  Worry too much - different things 2  Trouble  relaxing 3  Restless 2  Easily annoyed or irritable 1  Afraid - awful might happen 1  Total GAD 7 Score 13  Anxiety Difficulty Somewhat difficult    Past Medical History:  Diagnosis Date  . ADHD   . Anemia   . Anxiety   . Asthma   . Depression   . Diabetes mellitus without complication (HCC)   . GERD (gastroesophageal reflux disease)    Past Surgical History:  Procedure Laterality Date  . ESOPHAGOGASTRODUODENOSCOPY (EGD) WITH PROPOFOL N/A 09/01/2016   Procedure: ESOPHAGOGASTRODUODENOSCOPY (EGD) WITH PROPOFOL;  Surgeon: Everette Rank, MD;  Location: [facility] ENDOSCOPY;  Service: General;  Laterality: N/A;  . GASTRIC BYPASS  2012  . KNEE ARTHROSCOPY Left 2011   Social History   Socioeconomic History  . Marital status: Divorced    Spouse name: Not on file  . Number of children: 0  . Years of education: Not on file  . Highest education level: Bachelor's degree (e.g., BA, AB, BS)  Occupational History  . Not on file  Social Needs  . Financial resource strain: Not hard at all  . Food insecurity:    Worry: Never true    Inability: Never true  . Transportation needs:    Medical: No    Non-medical: No  Tobacco Use  . Smoking status: Former Smoker    Years: 2.00    Last attempt to quit: 01/02/2018    Years since quitting: 1.0  . Smokeless tobacco: Never Used  Substance and Sexual Activity  .  Alcohol use: Yes  . Drug use: Never  . Sexual activity: Yes    Birth control/protection: Condom  Lifestyle  . Physical activity:    Days per week: 3 days    Minutes per session: 50 min  . Stress: Not on file  Relationships  . Social connections:    Talks on phone: Not on file    Gets together: Not on file    Attends religious service: Not on file    Active member of club or organization: Not on file    Attends meetings of clubs or organizations: Not on file    Relationship status: Not on file  . Intimate partner violence:    Fear of current or ex partner: No    Emotionally  abused: No    Physically abused: No    Forced sexual activity: No  Other Topics Concern  . Not on file  Social History Narrative  . Not on file   Family History  Problem Relation Age of Onset  . Diabetes Mother   . Ovarian cancer Mother   . Heart disease Father   . Stroke Father   . Diabetes Father   . Depression Father   . Thyroid disease Father    Current Outpatient Medications on File Prior to Visit  Medication Sig  . albuterol (PROVENTIL HFA;VENTOLIN HFA) 108 (90 Base) MCG/ACT inhaler Inhale into the lungs.  . traZODone (DESYREL) 100 MG tablet Take 300 mg by mouth at bedtime.   . valACYclovir (VALTREX) 1000 MG tablet Take 1,000 mg by mouth daily.   No current facility-administered medications on file prior to visit.     Review of Systems  Constitutional: Negative for activity change, appetite change and fatigue.  Respiratory: Negative for cough and shortness of breath.   Cardiovascular: Negative for chest pain, palpitations and leg swelling.  Gastrointestinal: Negative for constipation, diarrhea, nausea and vomiting.  Genitourinary: Negative for dysuria, frequency and urgency.  Musculoskeletal: Negative for arthralgias and myalgias.  Skin: Negative for rash.  Neurological: Negative for dizziness and headaches.  Psychiatric/Behavioral: Positive for dysphoric mood. Negative for self-injury, sleep disturbance and suicidal ideas. The patient is nervous/anxious.    Per HPI unless specifically indicated above     Objective:    BP 136/82 (BP Location: Left Wrist, Patient Position: Sitting, Cuff Size: Large)   Pulse 77   Temp 98 F (36.7 C) (Oral)   Ht  (1.651 m)   Wt (!) 306 lb 9.6 oz (139.1 kg)   SpO2 99%   BMI 51.02 kg/m   Wt Readings from Last 3 Encounters:  01/03/19 (!) 306 lb 9.6 oz (139.1 kg)  01/03/18 278 lb (126.1 kg)  09/01/16 278 lb (126.1 kg)    Physical Exam Vitals signs reviewed.  Constitutional:      General: She is not in acute distress.     Appearance: She is well-developed. She is morbidly obese.  HENT:     Head: Normocephalic and atraumatic.     Right Ear: Hearing, tympanic membrane, ear canal and external ear normal.     Left Ear: Hearing, tympanic membrane, ear canal and external ear normal.     Nose: Nose normal.     Mouth/Throat:     Lips: Pink.     Mouth: Mucous membranes are moist.     Dentition: Normal dentition.     Palate: No mass.     Pharynx: Oropharynx is clear. Posterior oropharyngeal erythema (mildly injected) present. No pharyngeal swelling or oropharyngeal  exudate.     Tonsils: Swelling: 1+ on the right. 1+ on the left.  Eyes:     General: Lids are normal.  Cardiovascular:     Rate and Rhythm: Normal rate and regular rhythm.     Pulses:          Radial pulses are 2+ on the right side and 2+ on the left side.       Posterior tibial pulses are 1+ on the right side and 1+ on the left side.     Heart sounds: Normal heart sounds, S1 normal and S2 normal.  Pulmonary:     Effort: Pulmonary effort is normal. No respiratory distress.     Breath sounds: Normal breath sounds and air entry.  Abdominal:     General: Bowel sounds are normal. There is no distension.     Palpations: Abdomen is soft.     Tenderness: There is no abdominal tenderness.     Hernia: No hernia is present.  Musculoskeletal:     Right lower leg: No edema.     Left lower leg: No edema.  Skin:    General: Skin is warm and dry.     Capillary Refill: Capillary refill takes less than 2 seconds.  Neurological:     Mental Status: She is alert and oriented to person, place, and time.  Psychiatric:        Attention and Perception: Attention normal.        Mood and Affect: Mood and affect normal.        Behavior: Behavior normal. Behavior is cooperative.    Results for orders placed or performed during the hospital encounter of 01/04/18  Urinalysis, Complete w Microscopic  Result Value Ref Range   Color, Urine STRAW (A) YELLOW    APPearance CLEAR (A) CLEAR   Specific Gravity, Urine 1.004 (L) 1.005 - 1.030   pH 6.0 5.0 - 8.0   Glucose, UA NEGATIVE NEGATIVE mg/dL   Hgb urine dipstick NEGATIVE NEGATIVE   Bilirubin Urine NEGATIVE NEGATIVE   Ketones, ur NEGATIVE NEGATIVE mg/dL   Protein, ur NEGATIVE NEGATIVE mg/dL   Nitrite NEGATIVE NEGATIVE   Leukocytes, UA NEGATIVE NEGATIVE   RBC / HPF 0-5 0 - 5 RBC/hpf   WBC, UA 0-5 0 - 5 WBC/hpf   Bacteria, UA NONE SEEN NONE SEEN   Squamous Epithelial / LPF 0-5 (A) NONE SEEN  CBC  Result Value Ref Range   WBC 8.8 3.6 - 11.0 K/uL   RBC 4.17 3.80 - 5.20 MIL/uL   Hemoglobin 11.9 (L) 12.0 - 16.0 g/dL   HCT 16.1 09.6 - 04.5 %   MCV 88.5 80.0 - 100.0 fL   MCH 28.5 26.0 - 34.0 pg   MCHC 32.3 32.0 - 36.0 g/dL   RDW 40.9 81.1 - 91.4 %   Platelets 250 150 - 440 K/uL  Comprehensive metabolic panel  Result Value Ref Range   Sodium 136 135 - 145 mmol/L   Potassium 3.6 3.5 - 5.1 mmol/L   Chloride 105 101 - 111 mmol/L   CO2 21 (L) 22 - 32 mmol/L   Glucose, Bld 99 65 - 99 mg/dL   BUN 12 6 - 20 mg/dL   Creatinine, Ser 7.82 0.44 - 1.00 mg/dL   Calcium 8.9 8.9 - 95.6 mg/dL   Total Protein 7.9 6.5 - 8.1 g/dL   Albumin 3.8 3.5 - 5.0 g/dL   AST 78 (H) 15 - 41 U/L   ALT 33 14 - 54  U/L   Alkaline Phosphatase 73 38 - 126 U/L   Total Bilirubin 1.0 0.3 - 1.2 mg/dL   GFR calc non Af Amer >60 >60 mL/min   GFR calc Af Amer >60 >60 mL/min   Anion gap 10 5 - 15  Lipase, blood  Result Value Ref Range   Lipase 46 11 - 51 U/L  Pregnancy, urine  Result Value Ref Range   Preg Test, Ur NEGATIVE NEGATIVE      Assessment & Plan:   Problem List Items Addressed This Visit    None    Visit Diagnoses    Gastroesophageal reflux disease, esophagitis presence not specified    -  Primary Currently uncontrolled on omeprazole 60 mg daily.  Patient is not taking doses consistently at the same time, but states she always takes 60 mg daily even if at one dose.  Plan: 1. Encouraged patient to take bid  every day and to take 30 mins before meals.  Take omeprazole 40 mg bid.. Side effects discussed.  2. Avoid diet triggers. Reviewed need to seek care if globus sensation, difficulty swallowing, s/sx of GI bleed. 3. Follow up as needed and in 3 months.    Relevant Medications   omeprazole (PRILOSEC) 40 MG capsule   Encounter to establish care     Previous PCP was several years ato.  Records will not be requested. Recent care from specialists available in CareEverywhere and is reviewed today.  Past medical, family, and surgical history reviewed w/ patient in clinic.  Patient will need anxiety/depression reassessment soon.  Elevated scores today due to death of her father about 4 weeks ago, but review of symptom duration suggests patient may have chronic anxiety/depression.     Mild intermittent asthma with exacerbation     Patient with post-viral cough/bronchitis characterized as mild asthma exacerbation following likely influenza.  Plan: 1. Chest xray today to eval for pneumonia. - negative 2. START prednisone 50 mg one tab daily for 5 days 3. Continue albuterol regularly for next 5-7 days, then reduce to prn. 4. Continue Tussin-DM, can try benzonatate - reviewed it may not be effective. 5. Follow-up prn.   Relevant Medications   albuterol (PROVENTIL HFA;VENTOLIN HFA) 108 (90 Base) MCG/ACT inhaler   predniSONE (DELTASONE) 50 MG tablet   benzonatate (TESSALON) 100 MG capsule   Other Relevant Orders   DG Chest 2 View (Completed)   Cough with fever       Relevant Medications   benzonatate (TESSALON) 100 MG capsule   Other Relevant Orders   DG Chest 2 View (Completed)      Meds ordered this encounter  Medications  . omeprazole (PRILOSEC) 40 MG capsule    Sig: Take 1 capsule (40 mg total) by mouth 2 (two) times daily before a meal.    Dispense:  60 capsule    Refill:  5    Order Specific Question:   Supervising Provider    Answer:   Smitty Cords [[id number]]  . predniSONE  (DELTASONE) 50 MG tablet    Sig: Take 1 tablet (50 mg total) by mouth daily with breakfast for 5 days.    Dispense:  5 tablet    Refill:  0    Order Specific Question:   Supervising Provider    Answer:   Smitty Cords [[id number]]  . benzonatate (TESSALON) 100 MG capsule    Sig: Take 1 capsule (100 mg total) by mouth 2 (two) times daily as needed for cough.  Dispense:  20 capsule    Refill:  0    Order Specific Question:   Supervising Provider    Answer:   Smitty Cords [[id number]]   Follow up plan: Return in about 4 weeks (around 01/31/2019) for anxiety, depression.  Wilhelmina Mcardle, DNP, AGPCNP-BC Adult Gerontology Primary Care Nurse Practitioner [facility] 01/03/2019, 10:48 AM

## 2019-01-06 ENCOUNTER — Encounter: Payer: Self-pay | Admitting: Nurse Practitioner

## 2019-01-11 ENCOUNTER — Telehealth: Payer: Self-pay

## 2019-01-11 NOTE — Telephone Encounter (Signed)
Most likely patient has bronchitis.  This was also discussed in clinic at her visit.  Pneumonia was excluded with normal xray.  Cough from bronchitis after flu can last up to 3 months.  Unfortunately, there is not much to offer at this time.  Continue using Robitussin DM, albuterol as needed.  Patient may stop tessalon perles.

## 2019-01-11 NOTE — Telephone Encounter (Signed)
The pt called complaining of no improvement with her coughing, SOB after taking the Tessalon pearles, Robitussin, albuterol inhaler and Prednisone. She was seen in the x 8 days ago and treated for esophagitis.  Her symptoms been persistent since her diagnoses of influenza x 1 mth ago. Please advise

## 2019-01-11 NOTE — Telephone Encounter (Signed)
The pt was notified and she verbalize understanding.  

## 2019-02-03 ENCOUNTER — Encounter: Payer: Self-pay | Admitting: Nurse Practitioner

## 2019-02-03 ENCOUNTER — Ambulatory Visit (INDEPENDENT_AMBULATORY_CARE_PROVIDER_SITE_OTHER): Payer: BLUE CROSS/BLUE SHIELD | Admitting: Nurse Practitioner

## 2019-02-03 ENCOUNTER — Other Ambulatory Visit: Payer: Self-pay

## 2019-02-03 DIAGNOSIS — B373 Candidiasis of vulva and vagina: Secondary | ICD-10-CM

## 2019-02-03 DIAGNOSIS — N760 Acute vaginitis: Secondary | ICD-10-CM

## 2019-02-03 DIAGNOSIS — B3731 Acute candidiasis of vulva and vagina: Secondary | ICD-10-CM

## 2019-02-03 MED ORDER — METRONIDAZOLE 0.75 % VA GEL: 1.0000 | Freq: Every day | VAGINAL | 0 refills | Status: AC

## 2019-02-03 MED ORDER — FLUCONAZOLE 150 MG PO TABS: 150.0000 mg | ORAL_TABLET | Freq: Every day | ORAL | 0 refills | Status: DC

## 2019-02-03 NOTE — Progress Notes (Signed)
Telemedicine Encounter: Disclosed to patient at start of encounter that we will provide appropriate telemedicine services.  Patient consents to be treated via phone prior to discussion. - Patient is at her home and is accessed via telephone. - Services are provided by Wilhelmina Mcardle from [facility].  Subjective:    Patient ID: [name] Potts, female    DOB: 1976/09/04, 43 y.o.   MRN: [id number]  [name] Potts is a 43 y.o. female presenting on 02/03/2019 for Vaginal Discharge (white discharge, foul odor,  and mild vaginal itch x 2weeks. The pt tried monistat, but no improvement )  HPI Vaginal Discharge Discharge is "thick and pasty."  Odor is "a strong odor" and does not smell fishy. Patient used 3-day monistat. She has had BV and vaginal candidiasis in past.   - Patient has only had sexual intercourse in a mutually monogamous relationship.  She states that it "is not possible to have an STD" based on her current sexual habits.  Social History   Tobacco Use  . Smoking status: Former Smoker    Years: 2.00    Last attempt to quit: 01/02/2018    Years since quitting: 1.0  . Smokeless tobacco: Never Used  Substance Use Topics  . Alcohol use: Yes  . Drug use: Never    Review of Systems Per HPI unless specifically indicated above     Objective:    There were no vitals taken for this visit.  Wt Readings from Last 3 Encounters:  01/03/19 (!) 306 lb 9.6 oz (139.1 kg)  01/03/18 278 lb (126.1 kg)  09/01/16 278 lb (126.1 kg)    Physical Exam Patient remotely monitored.  Verbal communication appropriate.  Cognition normal.   Results for orders placed or performed during the hospital encounter of 01/04/18  Urinalysis, Complete w Microscopic  Result Value Ref Range   Color, Urine STRAW (A) YELLOW   APPearance CLEAR (A) CLEAR   Specific Gravity, Urine 1.004 (L) 1.005 - 1.030   pH 6.0 5.0 - 8.0   Glucose, UA NEGATIVE NEGATIVE mg/dL   Hgb  urine dipstick NEGATIVE NEGATIVE   Bilirubin Urine NEGATIVE NEGATIVE   Ketones, ur NEGATIVE NEGATIVE mg/dL   Protein, ur NEGATIVE NEGATIVE mg/dL   Nitrite NEGATIVE NEGATIVE   Leukocytes, UA NEGATIVE NEGATIVE   RBC / HPF 0-5 0 - 5 RBC/hpf   WBC, UA 0-5 0 - 5 WBC/hpf   Bacteria, UA NONE SEEN NONE SEEN   Squamous Epithelial / LPF 0-5 (A) NONE SEEN  CBC  Result Value Ref Range   WBC 8.8 3.6 - 11.0 K/uL   RBC 4.17 3.80 - 5.20 MIL/uL   Hemoglobin 11.9 (L) 12.0 - 16.0 g/dL   HCT 49.8 26.4 - 15.8 %   MCV 88.5 80.0 - 100.0 fL   MCH 28.5 26.0 - 34.0 pg   MCHC 32.3 32.0 - 36.0 g/dL   RDW 30.9 40.7 - 68.0 %   Platelets 250 150 - 440 K/uL  Comprehensive metabolic panel  Result Value Ref Range   Sodium 136 135 - 145 mmol/L   Potassium 3.6 3.5 - 5.1 mmol/L   Chloride 105 101 - 111 mmol/L   CO2 21 (L) 22 - 32 mmol/L   Glucose, Bld 99 65 - 99 mg/dL   BUN 12 6 - 20 mg/dL   Creatinine, Ser 8.81 0.44 - 1.00 mg/dL   Calcium 8.9 8.9 - 10.3 mg/dL   Total Protein 7.9 6.5 - 8.1 g/dL  Albumin 3.8 3.5 - 5.0 g/dL   AST 78 (H) 15 - 41 U/L   ALT 33 14 - 54 U/L   Alkaline Phosphatase 73 38 - 126 U/L   Total Bilirubin 1.0 0.3 - 1.2 mg/dL   GFR calc non Af Amer >60 >60 mL/min   GFR calc Af Amer >60 >60 mL/min   Anion gap 10 5 - 15  Lipase, blood  Result Value Ref Range   Lipase 46 11 - 51 U/L  Pregnancy, urine  Result Value Ref Range   Preg Test, Ur NEGATIVE NEGATIVE      Assessment & Plan:   Problem List Items Addressed This Visit    None    Visit Diagnoses    Vaginal candidiasis    -  Primary   Relevant Medications   fluconazole (DIFLUCAN) 150 MG tablet   Acute vaginitis       Relevant Medications   metroNIDAZOLE (METROGEL) 0.75 % vaginal gel    Per patient history and report, consistent with possible STD vs combined BV and candidal infection.  Plan: 1. Start Metronidazole gel one applicator full nightly for 5 days for BV.  2. START diflucan 150 mg one tablet once for yeast  infection. 3. Counseled on reducing recurrences with condom use, may try yogurt, probiotics, alternatively some patients are prone to recurrences with certain partners. 4. Follow-up prn 5-7 days.  May need OV in person for STD screening if not resolving.   Meds ordered this encounter  Medications  . fluconazole (DIFLUCAN) 150 MG tablet    Sig: Take 1 tablet (150 mg total) by mouth daily.    Dispense:  1 tablet    Refill:  0    Order Specific Question:   Supervising Provider    Answer:   Smitty CordsKARAMALEGOS, ALEXANDER J [[id number]]  . metroNIDAZOLE (METROGEL) 0.75 % vaginal gel    Sig: Place 1 Applicatorful vaginally at bedtime for 5 days. Treat at same time as yeast infection    Dispense:  70 g    Refill:  0    Order Specific Question:   Supervising Provider    Answer:   Smitty CordsKARAMALEGOS, ALEXANDER J [[id number]]   - Time spent in direct consultation with patient via telemedicine about above concerns: 5 minutes  Follow up plan: Follow-up prn.  Wilhelmina McardleLauren Hilliard Borges, DNP, AGPCNP-BC Adult Gerontology Primary Care Nurse Practitioner [facility] 02/03/2019, 2:49 PM

## 2019-05-22 ENCOUNTER — Encounter: Payer: Self-pay | Admitting: Nurse Practitioner

## 2019-05-22 ENCOUNTER — Other Ambulatory Visit: Payer: Self-pay

## 2019-05-22 ENCOUNTER — Ambulatory Visit (INDEPENDENT_AMBULATORY_CARE_PROVIDER_SITE_OTHER): Payer: BLUE CROSS/BLUE SHIELD | Admitting: Nurse Practitioner

## 2019-05-22 VITALS — BP 137/76 | HR 85 | Ht 65.0 in | Wt 322.6 lb

## 2019-05-22 DIAGNOSIS — M25512 Pain in left shoulder: Secondary | ICD-10-CM | POA: Diagnosis not present

## 2019-05-22 MED ORDER — NAPROXEN 500 MG PO TABS: 500.0000 mg | ORAL_TABLET | Freq: Two times a day (BID) | ORAL | 0 refills | Status: DC

## 2019-05-22 NOTE — Patient Instructions (Addendum)
[name] Potts,   Thank you for coming in to clinic today.  1. You have a LEFT shoulder muscle strain likely. - Take naproxen sodium 500 mg twice daily for 2 weeks. - Start taking Tylenol extra strength 1 to 2 tablets every 6-8 hours for aches or fever/chills for next few days as needed.  Do not take more than 3,000 mg in 24 hours from all medicines.  May take Ibuprofen as well if tolerated 200-400mg  every 8 hours as needed. May alternate tylenol and ibuprofen in same day. - Use heat and ice.  Apply this for 15 minutes at a time 6-8 times per day.   - Muscle rub with lidocaine, lidocaine patch, Biofreeze, or tiger balm for topical pain relief.  Avoid using this with heat and ice to avoid burns.  Please schedule a follow-up appointment with Cassell Smiles, AGNP. Return 2-4 weeks if symptoms worsen or fail to improve.  If you have any other questions or concerns, please feel free to call the clinic or send a message through [place]. You may also schedule an earlier appointment if necessary.  You will receive a survey after today's visit either digitally by e-mail or paper by [employer]. Your experiences and feedback matter to [place].  Please respond so we know how we are doing as we provide care for you.   Cassell Smiles, DNP, AGNP-BC Adult Gerontology Nurse Practitioner [facility]    Shoulder Range of Motion Exercises Shoulder range of motion (ROM) exercises are done to keep the shoulder moving freely or to increase movement. They are often recommended for people who have shoulder pain or stiffness or who are recovering from a shoulder surgery. Phase 1 exercises When you are able, do this exercise 1-2 times per day for 30-60 seconds in each direction, or as directed by your health care provider. Pendulum exercise To do this exercise while sitting: 1. Sit in a chair or at the edge of your bed with your feet flat on the floor. 2. Let your affected arm hang down in  front of you over the edge of the bed or chair. 3. Relax your shoulder, arm, and hand. [place] your body so your arm gently swings in small circles. You can also use your unaffected arm to start the motion. 5. Repeat changing the direction of the circles, swinging your arm left and right, and swinging your arm forward and back. To do this exercise while standing: 1. Stand next to a sturdy chair or table, and hold on to it with your hand on your unaffected side. 2. Bend forward at the waist. 3. Bend your knees slightly. 4. Relax your shoulder, arm, and hand. 5. While keeping your shoulder relaxed, use body motion to swing your arm in small circles. 6. Repeat changing the direction of the circles, swinging your arm left and right, and swinging your arm forward and back. 7. Between exercises, stand up tall and take a short break to relax your lower back.  Phase 2 exercises Do these exercises 1-2 times per day or as told by your health care provider. Hold each stretch for 30 seconds, and repeat 3 times. Do the exercises with one or both arms as instructed by your health care provider. For these exercises, sit at a table with your hand and arm supported by the table. A chair that slides easily or has wheels can be helpful. External rotation 1. Turn your chair so that your affected side is nearest to  the table. 2. Place your forearm on the table to your side. Bend your elbow about 90 at the elbow (right angle) and place your hand palm facing down on the table. Your elbow should be about 6 inches away from your side. 3. Keeping your arm on the table, lean your body forward. Abduction 1. Turn your chair so that your affected side is nearest to the table. 2. Place your forearm and hand on the table so that your thumb points toward the ceiling and your arm is straight out to your side. 3. Slide your hand out to the side and away from you, using your unaffected arm to do the work. 4. To increase the  stretch, you can slide your chair away from the table. Flexion: forward stretch 1. Sit facing the table. Place your hand and elbow on the table in front of you. 2. Slide your hand forward and away from you, using your unaffected arm to do the work. 3. To increase the stretch, you can slide your chair backward. Phase 3 exercises Do these exercises 1-2 times per day or as told by your health care provider. Hold each stretch for 30 seconds, and repeat 3 times. Do the exercises with one or both arms as instructed by your health care provider. Cross-body stretch: posterior capsule stretch 1. Lift your arm straight out in front of you. 2. Bend your arm 90 at the elbow (right angle) so your forearm moves across your body. 3. Use your other arm to gently pull the elbow across your body, toward your other shoulder. Wall climbs 1. Stand with your affected arm extended out to the side with your hand resting on a door frame. 2. Slide your hand slowly up the door frame. 3. To increase the stretch, step through the door frame. Keep your body upright and do not lean. Wand exercises You will need a cane, a piece of PVC pipe, or a sturdy wooden dowel for wand exercises. Flexion To do this exercise while standing: 1. Hold the wand with both of your hands, palms down. 2. Using the other arm to help, lift your arms up and over your head, if able. 3. Push upward with your other arm to gently increase the stretch. To do this exercise while lying down: 1. Lie on your back with your elbows resting on the floor and the wand in both your hands. Your hands will be palm down, or pointing toward your feet. 2. Lift your hands toward the ceiling, using your unaffected arm to help if needed. 3. Bring your arms overhead as able, using your unaffected arm to help if needed. Internal rotation 1. Stand while holding the wand behind you with both hands. Your unaffected arm should be extended above your head with the arm of  the affected side extended behind you at the level of your waist. The wand should be pointing straight up and down as you hold it. 2. Slowly pull the wand up behind your back by straightening the elbow of your unaffected arm and bending the elbow of your affected arm. External rotation 1. Lie on your back with your affected upper arm supported on a small pillow or rolled towel. When you first do this exercise, keep your upper arm close to your body. Over time, bring your arm up to a 90 angle out to the side. 2. Hold the wand across your stomach and with both hands palm up. Your elbow on your affected side should be bent at  a 90 angle. 3. Use your unaffected side to help push your forearm away from you and toward the floor. Keep your elbow on your affected side bent at a 90 angle. Contact a health care provider if you have:  New or increasing pain.  New numbness, tingling, weakness, or discoloration in your arm or hand. This information is not intended to replace advice given to you by your health care provider. Make sure you discuss any questions you have with your health care provider. Document Released: 07/11/2003 Document Revised: 11/24/2017 Document Reviewed: 11/24/2017 Elsevier Patient Education  2020 [employer]

## 2019-05-22 NOTE — Progress Notes (Signed)
Subjective:    Patient ID: [name] Potts, female    DOB: 25-Jan-1976, 43 y.o.   MRN: [id number]  [name] Potts is a 43 y.o. female presenting on 05/22/2019 for Arm Pain (pressure in the LUQ  of the chest that radiates down the arm. intermittent pain left arm 6 days )   HPI Left arm/chest pain No known injuries or significant changes.  Change in diet to do more meal prepping and eating at home to lose weight.  Patient notes pressure in left upper pectoral area, left upper arm and left lower arm has pressure and tingling in left hand.  Sometimes is severe.  Patient also notes some mild left neck pain with sensation to "pop [name] Potts jaw."  Time allows this to resolve.  No patterns with activity/rest.   - No personal history of heart problems - Family history of heart disease and this is patient's concern.   Social History   Tobacco Use  . Smoking status: Current Every Day Smoker    Packs/day: 0.10    Years: 2.00    Pack years: 0.20    Last attempt to quit: 01/02/2018    Years since quitting: 1.3  . Smokeless tobacco: Never Used  Substance Use Topics  . Alcohol use: Yes    Comment: occasionally  . Drug use: Never    Review of Systems Per HPI unless specifically indicated above     Objective:    BP 137/76 (BP Location: Left Arm, Patient Position: Sitting, Cuff Size: Large)   Pulse 85   Ht 5\' 5"  (1.651 m)   Wt (!) 322 lb 9.6 oz (146.3 kg)   BMI 53.68 kg/m   Wt Readings from Last 3 Encounters:  05/22/19 (!) 322 lb 9.6 oz (146.3 kg)  01/03/19 (!) 306 lb 9.6 oz (139.1 kg)  01/03/18 278 lb (126.1 kg)    Physical Exam Vitals signs reviewed.  Constitutional:      General: [name] Potts is not in acute distress.    Appearance: [name] Potts is well-developed. [name] Potts is obese.  HENT:     Head: Normocephalic and atraumatic.  Neck:     Musculoskeletal: Normal range of motion and neck supple.  Cardiovascular:     Rate and Rhythm: Normal rate and regular rhythm.     Pulses:          Radial pulses  are 2+ on the right side and 2+ on the left side.       Posterior tibial pulses are 1+ on the right side and 1+ on the left side.     Heart sounds: Normal heart sounds, S1 normal and S2 normal.  Pulmonary:     Effort: Pulmonary effort is normal. No respiratory distress.     Breath sounds: Normal breath sounds and air entry.  Musculoskeletal:     Right lower leg: No edema.     Left lower leg: No edema.     Comments: LEFT Shoulder Inspection: Normal appearance bilateral symmetrical Palpation: Mildly tender to palpation over anterior shoulder near pectoral muscles.  Non-tender to palpation over lateral, or posterior shoulder  ROM: Mildly limited AROM abduction.  Normal AROM forward flexion, abduction, internal / external rotation,  Special Testing: Rotator cuff testing negative for weakness with supraspinatus full can and empty can test, Hawkin's AC impingement negative for pain. Pain present with resistance during supraspinatus full and empty can tests. Strength: Normal strength 5/5 flex/ext, ext rot / int rot, grip, rotator cuff str testing. Neurovascular: Distally intact pulses, sensation  to light touch  Skin:    General: Skin is warm and dry.     Capillary Refill: Capillary refill takes less than 2 seconds.  Neurological:     Mental Status: [name] Potts is alert and oriented to person, place, and time.  Psychiatric:        Attention and Perception: Attention normal.        Mood and Affect: Mood and affect normal.        Behavior: Behavior normal. Behavior is cooperative.        Thought Content: Thought content normal.        Judgment: Judgment normal.      Assessment & Plan:   Problem List Items Addressed This Visit    None    Visit Diagnoses    Acute pain of left shoulder    -  Primary   Relevant Medications   naproxen (NAPROSYN) 500 MG tablet    Pain likely self-limited.  Muscle strain possible complicated by obesity and sedentary lifestyle with poor posture. Cannot exclude shoulder  arthritis, bursitis.  Plan:  1. Treat with OTC pain meds (acetaminophen) and naprosyn 500 mg bid x 15 days.  Discussed alternate dosing and max dosing.  Avoid all other NSAIDs while on naprosyn. 2. Apply heat and/or ice to affected area. 3. May also apply a muscle rub with lidocaine or lidocaine patch after heat or ice. 4. Can perform xray vs orthopedic referral in future if not improving.   5. Follow up 2-4 weeks.  If pain worsens, consider completing cardiac workup.  Deferred today due to replication of pain with shoulder exam.    Meds ordered this encounter  Medications  . naproxen (NAPROSYN) 500 MG tablet    Sig: Take 1 tablet (500 mg total) by mouth 2 (two) times daily with a meal.    Dispense:  30 tablet    Refill:  0    Order Specific Question:   Supervising Provider    Answer:   Smitty CordsKARAMALEGOS, ALEXANDER J [[id number]]    Follow up plan: Return 2-4 weeks if symptoms worsen or fail to improve.  Wilhelmina McardleLauren Arwen Haseley, DNP, AGPCNP-BC Adult Gerontology Primary Care Nurse Practitioner [facility] 05/22/2019, 8:41 AM

## 2019-06-06 ENCOUNTER — Other Ambulatory Visit: Payer: Self-pay

## 2019-06-06 ENCOUNTER — Ambulatory Visit (INDEPENDENT_AMBULATORY_CARE_PROVIDER_SITE_OTHER): Payer: BLUE CROSS/BLUE SHIELD | Admitting: Nurse Practitioner

## 2019-06-06 ENCOUNTER — Encounter: Payer: Self-pay | Admitting: Nurse Practitioner

## 2019-06-06 DIAGNOSIS — R300 Dysuria: Secondary | ICD-10-CM

## 2019-06-06 MED ORDER — CEPHALEXIN 500 MG PO CAPS: 500.0000 mg | ORAL_CAPSULE | Freq: Three times a day (TID) | ORAL | 0 refills | Status: AC

## 2019-06-06 MED ORDER — FLUCONAZOLE 150 MG PO TABS: 150.0000 mg | ORAL_TABLET | Freq: Once | ORAL | 0 refills | Status: DC

## 2019-06-06 NOTE — Progress Notes (Signed)
Telemedicine Encounter: Disclosed to patient at start of encounter that we will provide appropriate telemedicine services.  Patient consents to be treated via phone prior to discussion. - Patient is at her home and is accessed via telephone. - Services are provided by Wilhelmina McardleLauren Odalis Jordan from [facility].  Subjective:    Patient ID: [name] Potts, female    DOB: 1976/07/02, 43 y.o.   MRN: [id number]  [name] Potts is a 43 y.o. female presenting on 06/06/2019 for Dysuria (burning and painful  urination, urgency and frequency x 2 days. Pt state she drunk alot of alcohol over the last couple of days celebration her birthday. )  HPI  Dysuria Also has aching and urgency even though bladder is empty.  Has had a trace of blood when wiping today.  - Patient denies fever, flank pain.  Mild back pain - Patient has been sexually active recently.  No new partners.    Social History   Tobacco Use  . Smoking status: Former Smoker    Packs/day: 0.10    Years: 2.00    Pack years: 0.20    Quit date: 12/24/2017    Years since quitting: 1.4  . Smokeless tobacco: Never Used  Substance Use Topics  . Alcohol use: Yes    Comment: occasionally  . Drug use: Never    Review of Systems Per HPI unless specifically indicated above     Objective:    There were no vitals taken for this visit.  Wt Readings from Last 3 Encounters:  05/22/19 (!) 322 lb 9.6 oz (146.3 kg)  01/03/19 (!) 306 lb 9.6 oz (139.1 kg)  01/03/18 278 lb (126.1 kg)    Physical Exam Patient remotely monitored.  Verbal communication appropriate.  Cognition normal.   Results for orders placed or performed during the hospital encounter of 01/04/18  Urinalysis, Complete w Microscopic  Result Value Ref Range   Color, Urine STRAW (A) YELLOW   APPearance CLEAR (A) CLEAR   Specific Gravity, Urine 1.004 (L) 1.005 - 1.030   pH 6.0 5.0 - 8.0   Glucose, UA NEGATIVE NEGATIVE mg/dL   Hgb urine dipstick NEGATIVE NEGATIVE    Bilirubin Urine NEGATIVE NEGATIVE   Ketones, ur NEGATIVE NEGATIVE mg/dL   Protein, ur NEGATIVE NEGATIVE mg/dL   Nitrite NEGATIVE NEGATIVE   Leukocytes, UA NEGATIVE NEGATIVE   RBC / HPF 0-5 0 - 5 RBC/hpf   WBC, UA 0-5 0 - 5 WBC/hpf   Bacteria, UA NONE SEEN NONE SEEN   Squamous Epithelial / LPF 0-5 (A) NONE SEEN  CBC  Result Value Ref Range   WBC 8.8 3.6 - 11.0 K/uL   RBC 4.17 3.80 - 5.20 MIL/uL   Hemoglobin 11.9 (L) 12.0 - 16.0 g/dL   HCT 29.536.9 28.435.0 - 13.247.0 %   MCV 88.5 80.0 - 100.0 fL   MCH 28.5 26.0 - 34.0 pg   MCHC 32.3 32.0 - 36.0 g/dL   RDW 44.013.4 10.211.5 - 72.514.5 %   Platelets 250 150 - 440 K/uL  Comprehensive metabolic panel  Result Value Ref Range   Sodium 136 135 - 145 mmol/L   Potassium 3.6 3.5 - 5.1 mmol/L   Chloride 105 101 - 111 mmol/L   CO2 21 (L) 22 - 32 mmol/L   Glucose, Bld 99 65 - 99 mg/dL   BUN 12 6 - 20 mg/dL   Creatinine, Ser 3.660.90 0.44 - 1.00 mg/dL   Calcium 8.9 8.9 - 44.010.3 mg/dL   Total Protein 7.9  6.5 - 8.1 g/dL   Albumin 3.8 3.5 - 5.0 g/dL   AST 78 (H) 15 - 41 U/L   ALT 33 14 - 54 U/L   Alkaline Phosphatase 73 38 - 126 U/L   Total Bilirubin 1.0 0.3 - 1.2 mg/dL   GFR calc non Af Amer >60 >60 mL/min   GFR calc Af Amer >60 >60 mL/min   Anion gap 10 5 - 15  Lipase, blood  Result Value Ref Range   Lipase 46 11 - 51 U/L  Pregnancy, urine  Result Value Ref Range   Preg Test, Ur NEGATIVE NEGATIVE      Assessment & Plan:   Problem List Items Addressed This Visit    None    Visit Diagnoses    Dysuria    -  Primary    Acute cystitis with hematuria.  Pt symptomatic currently with increased suprapubic pressure x 2 days. Currently without systemic signs or symptoms of infection.   - No current risk of concurrent STI.  Plan: 1. START Keflex 500mg  3 times daily for next 5 days.   - Send Urine culture if needed for recurrence in future.  No urine sample provided today for virtual care. - Provided diflucan 150 mg one tablet x 1 dose for possible vaginal  candidiasis 2/2 abx.  Take prn symptoms of yeast infection. 2. Provided non-pharm measures for UTI prevention for good hygiene. 3. Drink plenty of fluids and improve hydration over next 1 week. 4. Provided precautions for severe symptoms requiring ED visit to include no urine in 24-48 hours. 5. Followup 2-5 days as needed for worsening or persistent symptoms.     Meds ordered this encounter  Medications  . cephALEXin (KEFLEX) 500 MG capsule    Sig: Take 1 capsule (500 mg total) by mouth 3 (three) times daily for 5 days.    Dispense:  15 capsule    Refill:  0    Order Specific Question:   Supervising Provider    Answer:   Olin Hauser [[id number]]  . fluconazole (DIFLUCAN) 150 MG tablet    Sig: Take 1 tablet (150 mg total) by mouth once for 1 dose.    Dispense:  1 tablet    Refill:  0    Order Specific Question:   Supervising Provider    Answer:   Olin Hauser [[id number]]    - Time spent in direct consultation with patient via telemedicine about above concerns: 5 minutes  Follow up plan:  Follow-up prn 5-7 days if no improvement.  Will need urine sample for future treatment.  Cassell Smiles, DNP, AGPCNP-BC Adult Gerontology Primary Care Nurse Practitioner [facility] 06/06/2019, 4:06 PM

## 2019-06-20 ENCOUNTER — Telehealth: Payer: Self-pay

## 2019-06-20 DIAGNOSIS — R300 Dysuria: Secondary | ICD-10-CM

## 2019-06-20 MED ORDER — FLUCONAZOLE 150 MG PO TABS: 150.0000 mg | ORAL_TABLET | Freq: Once | ORAL | 0 refills | Status: AC

## 2019-06-20 NOTE — Telephone Encounter (Signed)
Rx resent.

## 2019-06-20 NOTE — Telephone Encounter (Signed)
The pt called requesting a new prescription for diflucan. She said she got both prescription filled for UTI, but misplaced the diflucan prescription when it was time to take the medication. She is requesting that you send a new script to the [place], [place].

## 2019-08-22 ENCOUNTER — Other Ambulatory Visit: Payer: Self-pay | Admitting: Nurse Practitioner

## 2019-08-22 DIAGNOSIS — M25512 Pain in left shoulder: Secondary | ICD-10-CM

## 2019-08-31 ENCOUNTER — Other Ambulatory Visit: Payer: Self-pay

## 2019-08-31 DIAGNOSIS — M25512 Pain in left shoulder: Secondary | ICD-10-CM

## 2019-08-31 MED ORDER — NAPROXEN 500 MG PO TABS: 500.0000 mg | ORAL_TABLET | Freq: Two times a day (BID) | ORAL | 0 refills | Status: AC

## 2019-10-17 ENCOUNTER — Telehealth: Payer: Self-pay | Admitting: Nurse Practitioner

## 2019-10-17 DIAGNOSIS — A6000 Herpesviral infection of urogenital system, unspecified: Secondary | ICD-10-CM

## 2019-10-17 MED ORDER — VALACYCLOVIR HCL 1 G PO TABS: 1000.0000 mg | ORAL_TABLET | Freq: Every day | ORAL | 1 refills | Status: AC

## 2019-10-17 NOTE — Telephone Encounter (Signed)
Refilled for 90 days with refill  Nobie Putnam, DO [facility] 10/17/2019, 5:05 PM

## 2019-10-17 NOTE — Telephone Encounter (Signed)
Pt called requesting refill on Valtrex.

## 2019-12-25 ENCOUNTER — Ambulatory Visit: Payer: BLUE CROSS/BLUE SHIELD | Admitting: Family Medicine

## 2019-12-25 ENCOUNTER — Other Ambulatory Visit: Payer: Self-pay | Admitting: Nurse Practitioner

## 2019-12-25 DIAGNOSIS — K219 Gastro-esophageal reflux disease without esophagitis: Secondary | ICD-10-CM

## 2022-03-04 ENCOUNTER — Emergency Department: Payer: BLUE CROSS/BLUE SHIELD

## 2022-03-04 ENCOUNTER — Other Ambulatory Visit: Payer: Self-pay

## 2022-03-04 ENCOUNTER — Encounter: Payer: Self-pay | Admitting: Radiology

## 2022-03-04 ENCOUNTER — Emergency Department
Admission: EM | Admit: 2022-03-04 | Discharge: 2022-03-05 | Disposition: A | Discharge status: Home / Self Care | Payer: BLUE CROSS/BLUE SHIELD | Attending: Emergency Medicine | Admitting: Emergency Medicine

## 2022-03-04 DIAGNOSIS — R079 Chest pain, unspecified: Secondary | ICD-10-CM | POA: Insufficient documentation

## 2022-03-04 DIAGNOSIS — Z79899 Other long term (current) drug therapy: Secondary | ICD-10-CM | POA: Insufficient documentation

## 2022-03-04 DIAGNOSIS — E119 Type 2 diabetes mellitus without complications: Secondary | ICD-10-CM | POA: Diagnosis not present

## 2022-03-04 DIAGNOSIS — I10 Essential (primary) hypertension: Secondary | ICD-10-CM | POA: Diagnosis not present

## 2022-03-04 DIAGNOSIS — R1310 Dysphagia, unspecified: Secondary | ICD-10-CM | POA: Diagnosis not present

## 2022-03-04 DIAGNOSIS — R1010 Upper abdominal pain, unspecified: Secondary | ICD-10-CM | POA: Diagnosis not present

## 2022-03-04 DIAGNOSIS — R1013 Epigastric pain: Secondary | ICD-10-CM

## 2022-03-04 LAB — COMPREHENSIVE METABOLIC PANEL
ALT: 17 U/L (ref 0–44)
AST: 26 U/L (ref 15–41)
Albumin: 3.7 g/dL (ref 3.5–5.0)
Alkaline Phosphatase: 98 U/L (ref 38–126)
Anion gap: 7 (ref 5–15)
BUN: 14 mg/dL (ref 6–20)
CO2: 23 mmol/L (ref 22–32)
Calcium: 8.5 mg/dL — ABNORMAL LOW (ref 8.9–10.3)
Chloride: 107 mmol/L (ref 98–111)
Creatinine, Ser: 0.66 mg/dL (ref 0.44–1.00)
GFR, Estimated: >60 mL/min (ref >60)
Glucose, Bld: 84 mg/dL (ref 70–99)
Potassium: 3.9 mmol/L (ref 3.5–5.1)
Sodium: 137 mmol/L (ref 135–145)
Total Bilirubin: 0.4 mg/dL (ref 0.3–1.2)
Total Protein: 7.4 g/dL (ref 6.5–8.1)

## 2022-03-04 LAB — URINALYSIS, ROUTINE W REFLEX MICROSCOPIC
Bilirubin Urine: NEGATIVE
Glucose, UA: NEGATIVE mg/dL
Hgb urine dipstick: NEGATIVE
Ketones, ur: NEGATIVE mg/dL
Leukocytes,Ua: NEGATIVE
Nitrite: NEGATIVE
Protein, ur: NEGATIVE mg/dL
Specific Gravity, Urine: 1.018 (ref 1.005–1.030)
pH: 7 (ref 5.0–8.0)

## 2022-03-04 LAB — LIPASE, BLOOD: Lipase: 46 U/L (ref 11–51)

## 2022-03-04 LAB — CBC
HCT: 35.4 % — ABNORMAL LOW (ref 36.0–46.0)
Hemoglobin: 10.9 g/dL — ABNORMAL LOW (ref 12.0–15.0)
MCH: 25 pg — ABNORMAL LOW (ref 26.0–34.0)
MCHC: 30.8 g/dL (ref 30.0–36.0)
MCV: 81.2 fL (ref 80.0–100.0)
Platelets: 302 10*3/uL (ref 150–400)
RBC: 4.36 MIL/uL (ref 3.87–5.11)
RDW: 15.3 % (ref 11.5–15.5)
WBC: 6.9 10*3/uL (ref 4.0–10.5)
nRBC: 0 % (ref 0.0–0.2)

## 2022-03-04 LAB — TROPONIN I (HIGH SENSITIVITY)
Troponin I (High Sensitivity): 4 ng/L (ref <18)
Troponin I (High Sensitivity): 4 ng/L (ref <18)

## 2022-03-04 LAB — POC URINE PREG, ED: Preg Test, Ur: NEGATIVE

## 2022-03-04 LAB — CBG MONITORING, ED: Glucose-Capillary: 82 mg/dL (ref 70–99)

## 2022-03-04 LAB — TSH: TSH: 1.804 u[IU]/mL (ref 0.350–4.500)

## 2022-03-04 MED ORDER — FENTANYL CITRATE PF 50 MCG/ML IJ SOSY
50.0000 ug | PREFILLED_SYRINGE | Freq: Once | INTRAMUSCULAR | Status: AC
  Administered 2022-03-04: 50 ug via INTRAVENOUS
  Filled 2022-03-04: qty 1

## 2022-03-04 MED ORDER — LIDOCAINE VISCOUS HCL 2 % MT SOLN
15.0000 mL | Freq: Once | OROMUCOSAL | Status: AC
Start: 2022-03-04 — End: 2022-03-04
  Administered 2022-03-04: 15 mL via ORAL
  Filled 2022-03-04: qty 15

## 2022-03-04 MED ORDER — IOHEXOL 300 MG/ML  SOLN
75.0000 mL | Freq: Once | INTRAMUSCULAR | Status: AC | PRN
  Administered 2022-03-04: 75 mL via INTRAVENOUS

## 2022-03-04 MED ORDER — LORAZEPAM 2 MG/ML IJ SOLN
1.0000 mg | Freq: Once | INTRAMUSCULAR | Status: AC
  Administered 2022-03-04: 1 mg via INTRAVENOUS
  Filled 2022-03-04: qty 1

## 2022-03-04 MED ORDER — ALUM & MAG HYDROXIDE-SIMETH 200-200-20 MG/5ML PO SUSP
30.0000 mL | Freq: Once | ORAL | Status: AC
Start: 2022-03-04 — End: 2022-03-04
  Administered 2022-03-04: 30 mL via ORAL
  Filled 2022-03-04: qty 30

## 2022-03-04 MED ORDER — HALOPERIDOL LACTATE 5 MG/ML IJ SOLN: 5.0000 mg | Freq: Once | INTRAMUSCULAR | Status: DC

## 2022-03-04 MED ORDER — IOHEXOL 350 MG/ML SOLN
100.0000 mL | Freq: Once | INTRAVENOUS | Status: AC | PRN
  Administered 2022-03-04: 100 mL via INTRAVENOUS

## 2022-03-04 NOTE — ED Notes (Signed)
Dr. Derrill Kay at bedside assessing pt at this time.  ?

## 2022-03-04 NOTE — ED Provider Notes (Signed)
? ?[facility] ?Provider Note ? ? ? Event Date/Time  ? First MD Initiated Contact with Patient 03/04/22 1547   ?  (approximate) ? ? ?History  ? ?Chest Pain ? ? ?HPI ? ?[name] Potts is a 46 y.o. female  who, per clinic noted dated 02/03/22 has history of DM, GERD, who presents to the emergency department today because of concern for chest pain.  She states it started around 130 today.  It has been intermittent since then.  She states it is severe.  This has been accompanied by upper abdominal pain.  She additionally states that for the past few days she feels like she is having a hard time swallowing.  In addition for the past couple of weeks she has found that her blood pressures been going up and down.  She has been taking it at home.  She had been on amlodipine at 1 time although had been taken off of it although states she has tried some of her old medication recently with the elevated blood pressure. ? ? ?Physical Exam  ? ?Triage Vital Signs: ?ED Triage Vitals  ?Enc Vitals Group  ?   BP 03/04/22 1424 (!) 155/108  ?   Pulse Rate 03/04/22 1424 99  ?   Resp 03/04/22 1424 18  ?   Temp 03/04/22 1424 98.3 ?F (36.8 ?C)  ?   Temp src --   ?   SpO2 03/04/22 1424 95 %  ?   Weight 03/04/22 1425 (!) 322 lb 8.5 oz (146.3 kg)  ?   Height 03/04/22 1425 5\' 5"  (1.651 m)  ?   Head Circumference --   ?   Peak Flow --   ?   Pain Score 03/04/22 1425 7  ? ?Most recent vital signs: ?Vitals:  ? 03/04/22 1424 03/04/22 1521  ?BP: (!) 155/108 (!) 132/98  ?Pulse: 99 83  ?Resp: 18 16  ?Temp: 98.3 ?F (36.8 ?C)   ?SpO2: 95% 98%  ? ?General: Awake, alert, oriented. ?CV:  Good peripheral perfusion. Regular rate and rhythm. ?Resp:  Normal effort. Lungs clear. ?Abd:  No distention. Tender to palpation in the upper abdomen. ? ? ? ?ED Results / Procedures / Treatments  ? ?Labs ?(all labs ordered are listed, but only abnormal results are displayed) ?Labs Reviewed  ?COMPREHENSIVE METABOLIC PANEL - Abnormal; Notable for the  following components:  ?    Result Value  ? Calcium 8.5 (*)   ? All other components within normal limits  ?CBC - Abnormal; Notable for the following components:  ? Hemoglobin 10.9 (*)   ? HCT 35.4 (*)   ? MCH 25.0 (*)   ? All other components within normal limits  ?URINALYSIS, ROUTINE W REFLEX MICROSCOPIC - Abnormal; Notable for the following components:  ? Color, Urine YELLOW (*)   ? APPearance CLEAR (*)   ? All other components within normal limits  ?LIPASE, BLOOD  ?TSH  ?POC URINE PREG, ED  ?CBG MONITORING, ED  ?TROPONIN I (HIGH SENSITIVITY)  ?TROPONIN I (HIGH SENSITIVITY)  ? ? ? ?EKG ? ?I07/10/23, attending physician, personally viewed and interpreted this EKG ? ?EKG Time: 1427 ?Rate: 94 ?Rhythm: sinus rhythm  ?Axis: normal ?Intervals: qtc 460 ?QRS: narrow, q waves III, aVF ?ST changes: no st elevation ?Impression: abnormal ekg ? ?RADIOLOGY ?I independently interpreted and visualized the CXR. My interpretation: No pneumonia, no pneumothorax.  ?Radiology interpretation:  ?IMPRESSION:  ?No active disease.  ?   ? ? ?PROCEDURES: ? ?  Critical Care performed: No ? ?Procedures ? ? ?MEDICATIONS ORDERED IN ED: ?Medications - No data to display ? ? ?IMPRESSION / MDM / ASSESSMENT AND PLAN / ED COURSE  ?I reviewed the triage vital signs and the nursing notes. ?             ?               ? ?Differential diagnosis includes, but is not limited to, ACS, PE, pneumonia, GERD, costochondritis. ? ?Patient presented to the emergency department today with primary concern for chest pain.  On exam she did have some tenderness to her upper abdomen.  Troponin was negative.  EKG without any ST elevation.  Chest x-ray without any pneumonia or pneumothorax.  Patient additionally had complaint of difficulty swallowing.  I did obtain a CT head and cervical spine which did not show any mass or abnormality.  Patient does have difficulty with claustrophobia and PTSD so Ativan was necessary prior to the CT scan.  After CT scans the  patient then complained that the pain was moving down her stomach.  She also continued to complain of chest pain.  While I have low suspicion for dissection given continued and significance of pain and pain going down her abdomen will check CT dissection protocol. ? ? ?FINAL CLINICAL IMPRESSION(S) / ED DIAGNOSES  ? ?Chest pain ? ? ? ?Note:  This document was prepared using Dragon voice recognition software and may include unintentional dictation errors. ? ?  ?Phineas Semen, MD ?03/04/22 2317 ? ?

## 2022-03-04 NOTE — ED Triage Notes (Signed)
C/O chest pain and abdominal pain x 2 hours.  States blood pressure has been elevated intermittently "for a while'  States saying different words feels like my throat is swollen'. X 1 hour. ? ?AAOx3.  Skin warm and dry.  Speech clear, MAE equally and strong.  Gait steady.  Posture upright and relaxed.  NAD ?

## 2022-03-04 NOTE — ED Notes (Signed)
Called lab to check on status of troponin and TSH. Lab to receive and process now.  ?

## 2022-03-04 NOTE — ED Notes (Signed)
Pt states she is unable to give urine sample at this time, but verbalizes understanding that one is needed.  ?

## 2022-03-05 MED ORDER — HALOPERIDOL LACTATE 5 MG/ML IJ SOLN
5.0000 mg | Freq: Once | INTRAMUSCULAR | Status: AC
Start: 2022-03-05 — End: 2022-03-05
  Administered 2022-03-05: 5 mg via INTRAMUSCULAR
  Filled 2022-03-05: qty 1

## 2022-03-05 NOTE — ED Provider Notes (Signed)
Patient received in signout from Dr. Derrill Kay pending CTA chest abdomen pelvis to evaluate for acute dissection or other etiology of epigastric/chest pain.  Reviewed the CT and does not demonstrate any pathology, including aortic dissection, and aneurysmal pathology.  We discussed her longstanding GI history and outpatient work-up.  We discussed following up with her gastroenterologist and return precautions for the ED.  She is suitable for outpatient management. ?  ?Delton Prairie, MD ?03/05/22 0007 ? ?

## 2022-03-25 ENCOUNTER — Encounter: Payer: Self-pay | Admitting: Gastroenterology

## 2022-03-26 ENCOUNTER — Ambulatory Visit
Admission: RE | Admit: 2022-03-26 | Discharge: 2022-03-26 | Disposition: A | Discharge status: Home / Self Care | Payer: BLUE CROSS/BLUE SHIELD | Source: Ambulatory Visit | Attending: Gastroenterology | Admitting: Gastroenterology

## 2022-03-26 ENCOUNTER — Ambulatory Visit: Payer: BLUE CROSS/BLUE SHIELD | Admitting: Anesthesiology

## 2022-03-26 ENCOUNTER — Encounter
Admission: RE | Disposition: A | Discharge status: Home / Self Care | Payer: Self-pay | Source: Ambulatory Visit | Attending: Gastroenterology

## 2022-03-26 ENCOUNTER — Encounter: Payer: Self-pay | Admitting: Gastroenterology

## 2022-03-26 DIAGNOSIS — F32A Depression, unspecified: Secondary | ICD-10-CM | POA: Insufficient documentation

## 2022-03-26 DIAGNOSIS — F419 Anxiety disorder, unspecified: Secondary | ICD-10-CM | POA: Insufficient documentation

## 2022-03-26 DIAGNOSIS — K529 Noninfective gastroenteritis and colitis, unspecified: Secondary | ICD-10-CM | POA: Insufficient documentation

## 2022-03-26 DIAGNOSIS — Z800 Family history of malignant neoplasm of digestive organs: Secondary | ICD-10-CM | POA: Diagnosis not present

## 2022-03-26 DIAGNOSIS — K5730 Diverticulosis of large intestine without perforation or abscess without bleeding: Secondary | ICD-10-CM | POA: Insufficient documentation

## 2022-03-26 DIAGNOSIS — K219 Gastro-esophageal reflux disease without esophagitis: Secondary | ICD-10-CM | POA: Diagnosis not present

## 2022-03-26 DIAGNOSIS — Z9884 Bariatric surgery status: Secondary | ICD-10-CM | POA: Insufficient documentation

## 2022-03-26 DIAGNOSIS — Z8371 Family history of colonic polyps: Secondary | ICD-10-CM | POA: Insufficient documentation

## 2022-03-26 DIAGNOSIS — K635 Polyp of colon: Secondary | ICD-10-CM | POA: Insufficient documentation

## 2022-03-26 DIAGNOSIS — F17200 Nicotine dependence, unspecified, uncomplicated: Secondary | ICD-10-CM | POA: Insufficient documentation

## 2022-03-26 DIAGNOSIS — K640 First degree hemorrhoids: Secondary | ICD-10-CM | POA: Insufficient documentation

## 2022-03-26 DIAGNOSIS — J45909 Unspecified asthma, uncomplicated: Secondary | ICD-10-CM | POA: Diagnosis not present

## 2022-03-26 HISTORY — PX: COLONOSCOPY: SHX5424

## 2022-03-26 LAB — POCT PREGNANCY, URINE: Preg Test, Ur: NEGATIVE

## 2022-03-26 SURGERY — COLONOSCOPY
Anesthesia: General

## 2022-03-26 MED ORDER — PROPOFOL 500 MG/50ML IV EMUL
INTRAVENOUS | Status: AC
  Filled 2022-03-26: qty 50

## 2022-03-26 MED ORDER — SODIUM CHLORIDE 0.9 % IV SOLN: INTRAVENOUS | Status: DC

## 2022-03-26 MED ORDER — SODIUM CHLORIDE 0.9 % IV SOLN: INTRAVENOUS | Status: DC | PRN

## 2022-03-26 MED ORDER — PROPOFOL 500 MG/50ML IV EMUL
INTRAVENOUS | Status: DC | PRN
  Administered 2022-03-26: 200 ug/kg/min via INTRAVENOUS

## 2022-03-26 MED ORDER — LABETALOL HCL 5 MG/ML IV SOLN
INTRAVENOUS | Status: DC | PRN
  Administered 2022-03-26 (×2): 10 mg via INTRAVENOUS

## 2022-03-26 MED ORDER — PROPOFOL 10 MG/ML IV BOLUS
INTRAVENOUS | Status: DC | PRN
  Administered 2022-03-26: 50 mg via INTRAVENOUS
  Administered 2022-03-26: 100 mg via INTRAVENOUS
  Administered 2022-03-26: 50 mg via INTRAVENOUS

## 2022-03-26 NOTE — Anesthesia Preprocedure Evaluation (Signed)
Anesthesia Evaluation  Patient identified by MRN, date of birth, ID band Patient awake    Reviewed: Allergy & Precautions, NPO status , Patient's Chart, lab work & pertinent test results  History of Anesthesia Complications Negative for: history of anesthetic complications  Airway Mallampati: III  TM Distance: >3 FB Neck ROM: full    Dental  (+) Chipped   Pulmonary neg shortness of breath, asthma , Current Smoker and Patient abstained from smoking.,    Pulmonary exam normal        Cardiovascular Exercise Tolerance: Good (-) angina(-) Past MI Normal cardiovascular exam     Neuro/Psych negative neurological ROS  negative psych ROS   GI/Hepatic Neg liver ROS, GERD  Controlled,  Endo/Other  diabetes, Type 2  Renal/GU negative Renal ROS  negative genitourinary   Musculoskeletal   Abdominal   Peds  Hematology negative hematology ROS (+)   Anesthesia Other Findings Past Medical History: No date: ADHD No date: Anemia No date: Anxiety No date: Asthma No date: Depression No date: Diabetes mellitus without complication (HCC)     Comment:  lifestyle controlled - now off metformin (diarreha) No date: GERD (gastroesophageal reflux disease)  Past Surgical History: No date: DILATION AND CURETTAGE OF UTERUS 09/01/2016: ESOPHAGOGASTRODUODENOSCOPY (EGD) WITH PROPOFOL; N/A     Comment:  Procedure: ESOPHAGOGASTRODUODENOSCOPY (EGD) WITH               PROPOFOL;  Surgeon: Everette Rank, MD;  Location: [facility]               ENDOSCOPY;  Service: General;  Laterality: N/A; 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 2011: KNEE ARTHROSCOPY; Left  BMI    Body Mass Index: 42.51 kg/m      Reproductive/Obstetrics negative OB ROS                             Anesthesia Physical Anesthesia Plan  ASA: 3  Anesthesia Plan: General   Post-op Pain Management:    Induction: Intravenous  PONV Risk Score and Plan: Propofol  infusion and TIVA  Airway Management Planned: Natural Airway and Nasal Cannula  Additional Equipment:   Intra-op Plan:   Post-operative Plan:   Informed Consent: I have reviewed the patients History and Physical, chart, labs and discussed the procedure including the risks, benefits and alternatives for the proposed anesthesia with the patient or authorized representative who has indicated his/her understanding and acceptance.     Dental Advisory Given  Plan Discussed with: Anesthesiologist, CRNA and Surgeon  Anesthesia Plan Comments: (Patient consented for risks of anesthesia including but not limited to:  - adverse reactions to medications - risk of airway placement if required - damage to eyes, teeth, lips or other oral mucosa - nerve damage due to positioning  - sore throat or hoarseness - Damage to heart, brain, nerves, lungs, other parts of body or loss of life  Patient voiced understanding.)        Anesthesia Quick Evaluation

## 2022-03-26 NOTE — Transfer of Care (Signed)
Immediate Anesthesia Transfer of Care Note  Patient: [name] Potts  Procedure(s) Performed: COLONOSCOPY  Patient Location: PACU  Anesthesia Type:General  Level of Consciousness: awake, alert  and oriented  Airway & Oxygen Therapy: Patient Spontanous Breathing and Patient connected to nasal cannula oxygen  Post-op Assessment: Report given to RN and Post -op Vital signs reviewed and stable  Post vital signs: Reviewed and stable  Last Vitals:  Vitals Value Taken Time  BP 162/93 03/26/22 1047  Temp 36.2 C 03/26/22 1046  Pulse 91 03/26/22 1050  Resp 16 03/26/22 1050  SpO2 99 % 03/26/22 1050  Vitals shown include unvalidated device data.  Last Pain:  Vitals:   03/26/22 1046  TempSrc:   PainSc: 0-No pain         Complications: No notable events documented.

## 2022-03-26 NOTE — Op Note (Signed)
[facility] Gastroenterology Patient Name: [name] Potts Procedure Date: 03/26/2022 9:34 AM MRN: [id number] Account #: [id number] Date of Birth: [birth year] Admit Type: Outpatient Age: 46 Room: [facility] ENDO ROOM 1 Gender: Female Note Status: Finalized Instrument Name: Colonoscope [id number] Procedure:             Colonoscopy Indications:           Chronic diarrhea Providers:             Rueben Bash, DO Referring MD:          Mia Creek, MD Medicines:             Monitored Anesthesia Care Complications:         No immediate complications. Estimated blood loss:                         Minimal. Procedure:             Pre-Anesthesia Assessment:                        - Prior to the procedure, a History and Physical was                         performed, and patient medications and allergies were                         reviewed. The patient is competent. The risks and                         benefits of the procedure and the sedation options and                         risks were discussed with the patient. All questions                         were answered and informed consent was obtained.                         Patient identification and proposed procedure were                         verified by the physician, the nurse, the anesthetist                         and the technician in the endoscopy suite. Mental                         Status Examination: alert and oriented. Airway                         Examination: normal oropharyngeal airway and neck                         mobility. Respiratory Examination: clear to                         auscultation. CV Examination: RRR, no murmurs, no S3  or S4. Prophylactic Antibiotics: The patient does not                         require prophylactic antibiotics. Prior                         Anticoagulants: The patient has taken no previous                         anticoagulant or  antiplatelet agents. ASA Grade                         Assessment: III - A patient with severe systemic                         disease. After reviewing the risks and benefits, the                         patient was deemed in satisfactory condition to                         undergo the procedure. The anesthesia plan was to use                         monitored anesthesia care (MAC). Immediately prior to                         administration of medications, the patient was                         re-assessed for adequacy to receive sedatives. The                         heart rate, respiratory rate, oxygen saturations,                         blood pressure, adequacy of pulmonary ventilation, and                         response to care were monitored throughout the                         procedure. The physical status of the patient was                         re-assessed after the procedure.                        After obtaining informed consent, the colonoscope was                         passed under direct vision. Throughout the procedure,                         the patient's blood pressure, pulse, and oxygen                         saturations were monitored continuously. The  Colonoscope was introduced through the anus and                         advanced to the the terminal ileum, with                         identification of the appendiceal orifice and IC                         valve. The colonoscopy was somewhat difficult due to                         significant looping and the patient's body habitus.                         Successful completion of the procedure was aided by                         straightening and shortening the scope to obtain bowel                         loop reduction, using scope torsion, applying                         abdominal pressure and lavage. The patient tolerated                         the procedure well. The  quality of the bowel                         preparation was evaluated using the BBPS [facility] Bowel                         Preparation Scale) with scores of: Right Colon = 3,                         Transverse Colon = 3 and Left Colon = 3 (entire mucosa                         seen well with no residual staining, small fragments                         of stool or opaque liquid). The total BBPS score                         equals 9. The terminal ileum, ileocecal valve,                         appendiceal orifice, and rectum were photographed. Findings:      The perianal and digital rectal examinations were normal. Pertinent       negatives include normal sphincter tone.      The terminal ileum appeared normal. Estimated blood loss: none.      Non-bleeding internal hemorrhoids were found during retroflexion. The       hemorrhoids were Grade I (internal hemorrhoids that do not prolapse).       Estimated blood loss: none.  A few small-mouthed diverticula were found in the left colon. Estimated       blood loss: none.      Normal mucosa was found in the entire colon. Biopsies for histology were       taken with a cold forceps from the right colon and left colon for       evaluation of microscopic colitis. Estimated blood loss was minimal.      A 3 to 4 mm polyp was found in the ascending colon. The polyp was       sessile. The polyp was removed with a cold snare. Resection and       retrieval were complete. Estimated blood loss was minimal.      Two sessile polyps were found in the descending colon and ascending       colon. The polyps were 1 to 2 mm in size. These polyps were removed with       a cold biopsy forceps. Resection and retrieval were complete. Estimated       blood loss was minimal.      The exam was otherwise without abnormality on direct and retroflexion       views. Impression:            - The examined portion of the ileum was normal.                        -  Non-bleeding internal hemorrhoids.                        - Diverticulosis in the left colon.                        - Normal mucosa in the entire examined colon. Biopsied.                        - One 3 to 4 mm polyp in the ascending colon, removed                         with a cold snare. Resected and retrieved.                        - Two 1 to 2 mm polyps in the descending colon and in                         the ascending colon, removed with a cold biopsy                         forceps. Resected and retrieved.                        - The examination was otherwise normal on direct and                         retroflexion views. Recommendation:        - Discharge patient to home.                        - Resume previous diet.                        -  Continue present medications.                        - Await pathology results.                        - Repeat colonoscopy for surveillance based on                         pathology results.                        - Return to GI office as previously scheduled.                        - No aspirin, ibuprofen, naproxen, or other                         non-steroidal anti-inflammatory drugs for 5 days after                         polyp removal.                        - The findings and recommendations were discussed with                         the patient. Procedure Code(s):     --- Professional ---                        [id number], Colonoscopy, flexible; with removal of                         tumor(s), polyp(s), or other lesion(s) by snare                         technique                        45380, 72, Colonoscopy, flexible; with biopsy, single                         or multiple Diagnosis Code(s):     --- Professional ---                        K64.0, First degree hemorrhoids                        K63.5, Polyp of colon                        K52.9, Noninfective gastroenteritis and colitis,                         unspecified                         K57.30, Diverticulosis of large intestine without                         perforation or abscess without bleeding CPT copyright 2019 American Medical Association. All rights reserved. The codes documented in this  report are preliminary and upon coder review may  be revised to meet current compliance requirements. Attending Participation:      I personally performed the entire procedure. Volney American, DO Annamaria Helling DO, DO 03/26/2022 10:45:55 AM This report has been signed electronically. Number of Addenda: 0 Note Initiated On: 03/26/2022 9:34 AM Scope Withdrawal Time: 0 hours 18 minutes 24 seconds  Total Procedure Duration: 0 hours 37 minutes 57 seconds  Estimated Blood Loss:  Estimated blood loss was minimal.      [facility]

## 2022-03-26 NOTE — Interval H&P Note (Signed)
History and Physical Interval Note: Preprocedure H&P from 03/26/22  was reviewed and there was no interval change after seeing and examining the patient.  Written consent was obtained from the patient after discussion of risks, benefits, and alternatives. Patient has consented to proceed with Colonoscopy with possible intervention   03/26/2022 9:37 AM  [name] Potts  has presented today for surgery, with the diagnosis of Diarrhea, unspecified type (R19.7).  The various methods of treatment have been discussed with the patient and family. After consideration of risks, benefits and other options for treatment, the patient has consented to  Procedure(s): COLONOSCOPY (N/A) as a surgical intervention.  The patient's history has been reviewed, patient examined, no change in status, stable for surgery.  I have reviewed the patient's chart and labs.  Questions were answered to the patient's satisfaction.     Jaynie Collins

## 2022-03-26 NOTE — Anesthesia Postprocedure Evaluation (Signed)
Anesthesia Post Note  Patient: [name] Potts  Procedure(s) Performed: COLONOSCOPY  Patient location during evaluation: Endoscopy Anesthesia Type: General Level of consciousness: awake and alert Pain management: pain level controlled Vital Signs Assessment: post-procedure vital signs reviewed and stable Respiratory status: spontaneous breathing, nonlabored ventilation, respiratory function stable and patient connected to nasal cannula oxygen Cardiovascular status: blood pressure returned to baseline and stable Postop Assessment: no apparent nausea or vomiting Anesthetic complications: no   No notable events documented.   Last Vitals:  Vitals:   03/26/22 1046 03/26/22 1056  BP: (!) 162/93 (!) 182/109  Pulse: 92 80  Resp: 12 14  Temp: (!) 36.2 C   SpO2: 97% 98%    Last Pain:  Vitals:   03/26/22 1056  TempSrc:   PainSc: 0-No pain                 Cleda Mccreedy Kyomi Hector

## 2022-03-26 NOTE — Anesthesia Procedure Notes (Signed)
Date/Time: 03/26/2022 10:03 AM Performed by: Junious Silk, CRNA Pre-anesthesia Checklist: Patient identified, Emergency Drugs available, Suction available, Patient being monitored and Timeout performed Oxygen Delivery Method: Nasal cannula

## 2022-03-26 NOTE — H&P (Signed)
Pre-Procedure H&P   Patient ID: [name] Potts is a 46 y.o. female.  Gastroenterology Provider: Jaynie Collins, DO  Referring Provider: Tawni Pummel, PA PCP: Alease Medina, MD  Date: 03/26/2022  HPI [name] is a 47 y.o. female who presents today for Colonoscopy for worsening diarrhea. Patient has noted worsening diarrhea since her weight loss surgery.  She is to have 3-4 formed bowel movements per day but now has 10-15 loose stools.  She does note nocturnal awakening.  There is no formed to the stool but also no melena or hematochezia.  C. difficile testing has been negative.  She notes that eating greasy foods increases her symptoms. Bile acid sequestrant has helped, but not resolved the issue  She did undergo EGD in December 2022 with normal gastric sleeve appearing anatomy.  Family history of colon polyps in both parents. Maternal grandmother with history of CRC  Hemoglobin 10.6 MCV 81 platelets 202,000   Past Medical History:  Diagnosis Date   ADHD    Anemia    Anxiety    Asthma    Depression    Diabetes mellitus without complication (HCC)    lifestyle controlled - now off metformin (diarreha)   GERD (gastroesophageal reflux disease)     Past Surgical History:  Procedure Laterality Date   DILATION AND CURETTAGE OF UTERUS     ESOPHAGOGASTRODUODENOSCOPY (EGD) WITH PROPOFOL N/A 09/01/2016   Procedure: ESOPHAGOGASTRODUODENOSCOPY (EGD) WITH PROPOFOL;  Surgeon: Everette Rank, MD;  Location: [facility] ENDOSCOPY;  Service: General;  Laterality: N/A;   GASTRIC BYPASS  2012   KNEE ARTHROSCOPY Left 2011    Family History Family history of colon polyps in both parents. Maternal grandmother with history of CRC No h/o GI disease or malignancy  Review of Systems  Constitutional:  Negative for activity change, appetite change, chills, diaphoresis, fatigue, fever and unexpected weight change.  HENT:  Negative for trouble swallowing and voice change.    Respiratory:  Negative for shortness of breath and wheezing.   Cardiovascular:  Negative for chest pain, palpitations and leg swelling.  Gastrointestinal:  Positive for diarrhea. Negative for abdominal distention, abdominal pain, anal bleeding, blood in stool, constipation, nausea, rectal pain and vomiting.  Musculoskeletal:  Negative for arthralgias and myalgias.  Skin:  Negative for color change and pallor.  Neurological:  Negative for dizziness, syncope and weakness.  Psychiatric/Behavioral:  Negative for confusion.   All other systems reviewed and are negative.   Medications No current facility-administered medications on file prior to encounter.   Current Outpatient Medications on File Prior to Encounter  Medication Sig Dispense Refill   colestipol (COLESTID) 1 g tablet Take 1 g by mouth 2 (two) times daily.     DULoxetine (CYMBALTA) 60 MG capsule Take 60 mg by mouth daily.     ibuprofen (ADVIL) 800 MG tablet Take 800 mg by mouth every 6 (six) hours as needed.     Multiple Vitamin (MULTIVITAMIN) tablet Take 1 tablet by mouth daily.     omeprazole (PRILOSEC) 40 MG capsule TAKE 1 CAPSULE (40 MG TOTAL) BY MOUTH 2 (TWO) TIMES DAILY BEFORE A MEAL. 60 capsule 0   traZODone (DESYREL) 100 MG tablet Take 300 mg by mouth at bedtime.      valACYclovir (VALTREX) 1000 MG tablet Take 1 tablet (1,000 mg total) by mouth daily. 90 tablet 1   albuterol (PROVENTIL HFA;VENTOLIN HFA) 108 (90 Base) MCG/ACT inhaler Inhale into the lungs.     hydrOXYzine (ATARAX) 10 MG tablet  Take 10 mg by mouth 3 (three) times daily as needed for itching. (Patient not taking: Reported on 03/26/2022)     hyoscyamine (LEVSIN SL) 0.125 MG SL tablet Place 0.125 mg under the tongue every 6 (six) hours as needed for cramping. (Patient not taking: Reported on 03/26/2022)     naproxen (NAPROSYN) 500 MG tablet Take 1 tablet (500 mg total) by mouth 2 (two) times daily with a meal. (Patient not taking: Reported on 03/26/2022) 30 tablet 0     Pertinent medications related to GI and procedure were reviewed by me with the patient prior to the procedure   Current Facility-Administered Medications:    0.9 %  sodium chloride infusion, , Intravenous, Continuous, Jaynie Collins, DO, Last Rate: 20 mL/hr at 03/26/22 0914, New Bag at 03/26/22 0914      No Known Allergies Allergies were reviewed by me prior to the procedure  Objective   Body mass index is 42.51 kg/m. Vitals:   03/26/22 0857  BP: (!) 156/120  Pulse: 73  Resp: 18  Temp: 98.1 F (36.7 C)  TempSrc: Temporal  SpO2: 98%  Weight: 108.9 kg  Height: 5\' 3"  (1.6 m)     Physical Exam Vitals and nursing note reviewed.  Constitutional:      General: She is not in acute distress.    Appearance: Normal appearance. She is obese. She is not ill-appearing, toxic-appearing or diaphoretic.  HENT:     Head: Normocephalic and atraumatic.     Nose: Nose normal.     Mouth/Throat:     Mouth: Mucous membranes are moist.     Pharynx: Oropharynx is clear.  Eyes:     General: No scleral icterus.    Extraocular Movements: Extraocular movements intact.  Cardiovascular:     Rate and Rhythm: Normal rate and regular rhythm.     Heart sounds: Normal heart sounds. No murmur heard.   No friction rub. No gallop.  Pulmonary:     Effort: Pulmonary effort is normal. No respiratory distress.     Breath sounds: Normal breath sounds. No wheezing, rhonchi or rales.  Abdominal:     General: Bowel sounds are normal. There is no distension.     Palpations: Abdomen is soft.     Tenderness: There is no abdominal tenderness. There is no guarding or rebound.  Musculoskeletal:     Cervical back: Neck supple.     Right lower leg: No edema.     Left lower leg: No edema.  Skin:    General: Skin is warm and dry.     Coloration: Skin is not jaundiced or pale.  Neurological:     General: No focal deficit present.     Mental Status: She is alert and oriented to person, place, and  time. Mental status is at baseline.  Psychiatric:        Mood and Affect: Mood normal.        Behavior: Behavior normal.        Thought Content: Thought content normal.        Judgment: Judgment normal.     Assessment:  [name] is a 46 y.o. female  who presents today for Colonoscopy for worsening chronic diarrhea.  Plan:  Colonoscopy with possible intervention today  Colonoscopy with possible biopsy, control of bleeding, polypectomy, and interventions as necessary has been discussed with the patient/patient representative. Informed consent was obtained from the patient/patient representative after explaining the indication, nature, and risks of the procedure including  but not limited to death, bleeding, perforation, missed neoplasm/lesions, cardiorespiratory compromise, and reaction to medications. Opportunity for questions was given and appropriate answers were provided. Patient/patient representative has verbalized understanding is amenable to undergoing the procedure.   Jaynie Collins, DO  [facility]  Portions of the record may have been created with voice recognition software. Occasional wrong-word or 'sound-a-like' substitutions may have occurred due to the inherent limitations of voice recognition software.  Read the chart carefully and recognize, using context, where substitutions may have occurred.

## 2022-03-27 ENCOUNTER — Encounter: Payer: Self-pay | Admitting: Gastroenterology

## 2022-03-27 LAB — SURGICAL PATHOLOGY

## 2023-06-07 IMAGING — CT CT HEAD W/O CM
4 series · 17 of 47 positions shown, 19 images · non-contrast
Comparison: None Available.

CLINICAL DATA: Left facial numbness



[Series 2: head wo · axial · 0.45mm/px · z∈[-98,+22]mm · 7 of 33 slices shown, 9 images]
[im 5/33  brain]
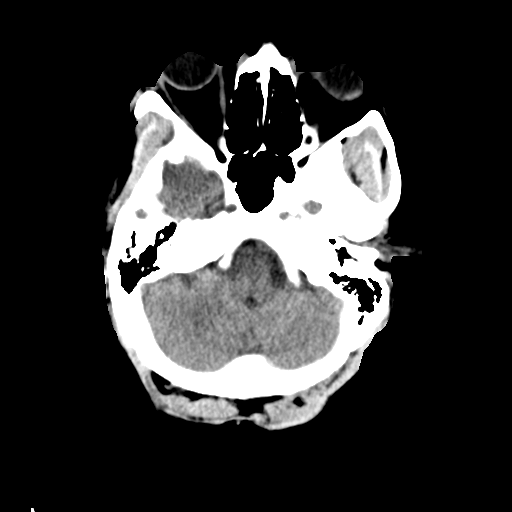
[im 5/33  bone]
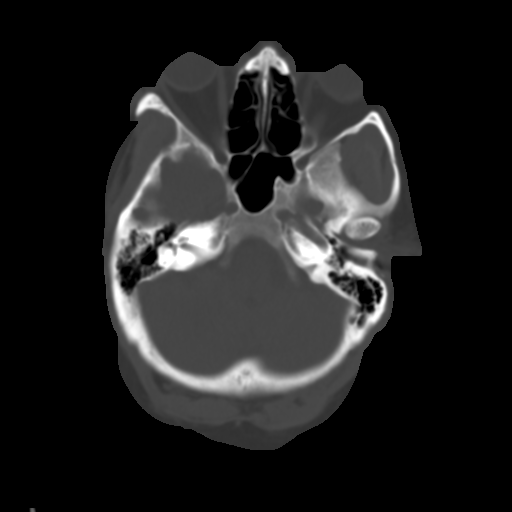
[im 9/33  brain]
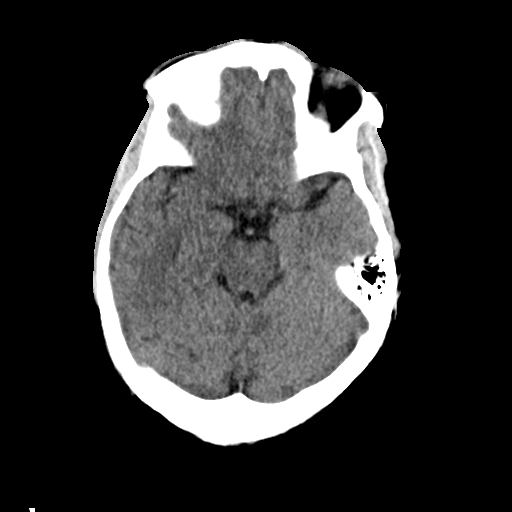
[im 13/33  brain]
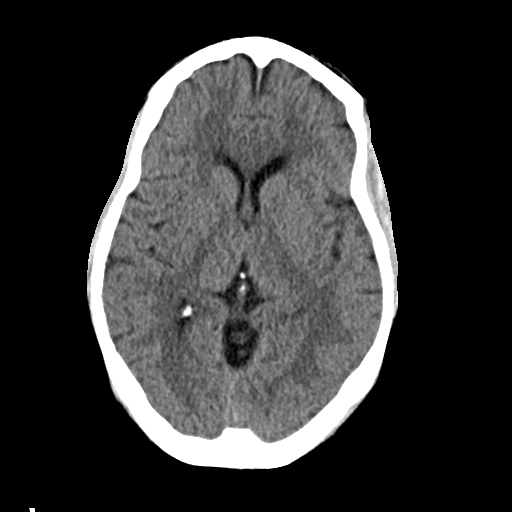
[im 17/33  brain]
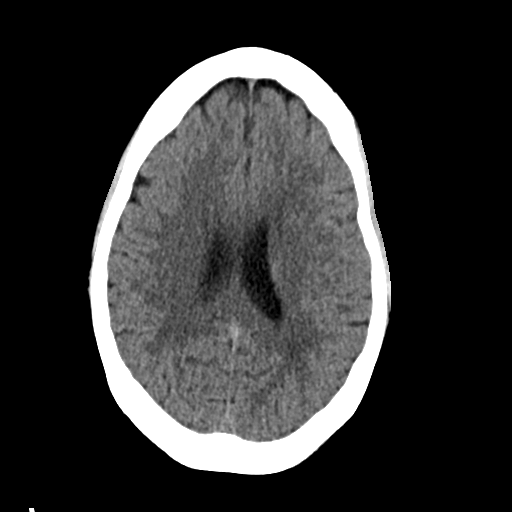
[im 21/33  brain]
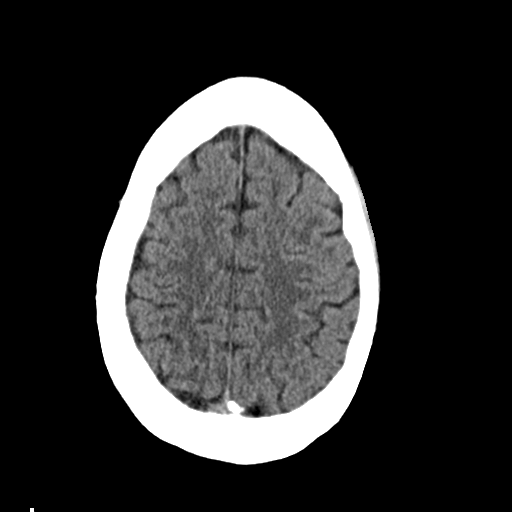
[im 21/33  bone]
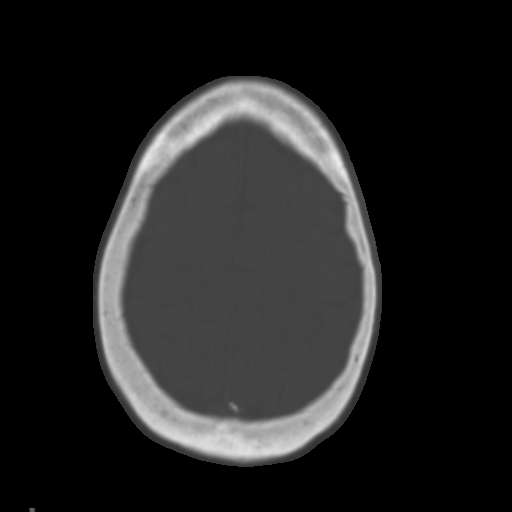
[im 25/33  brain]
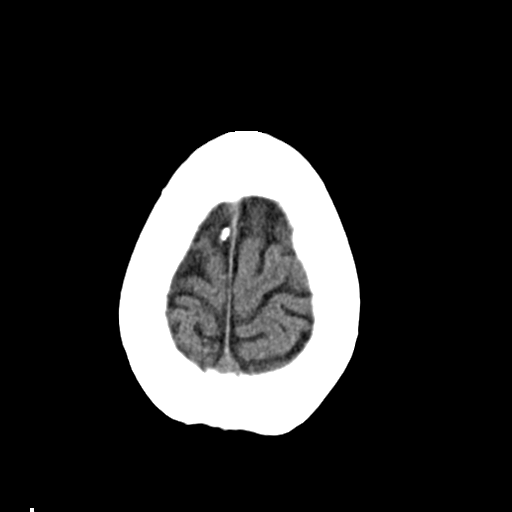
[im 29/33  brain]
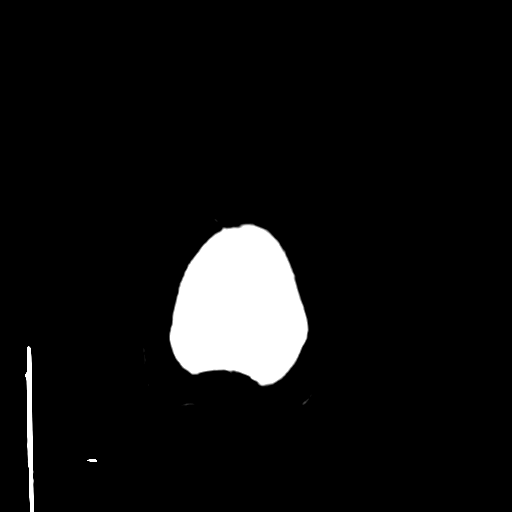

[Series 3: head bone · axial · 0.45mm/px · z∈[-102,-46]mm · 4 of 81 slices shown]
[im 9/81  bone]
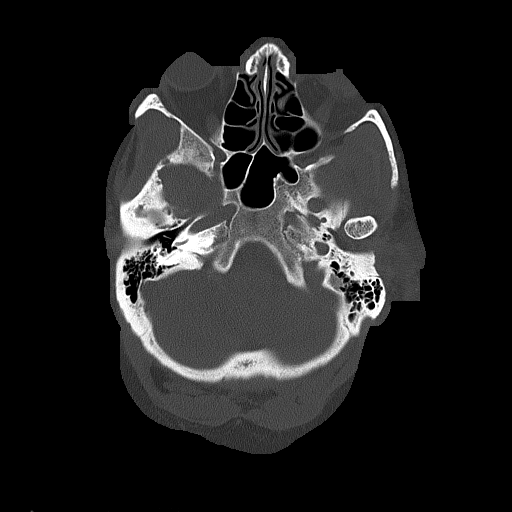
[im 17/81  bone]
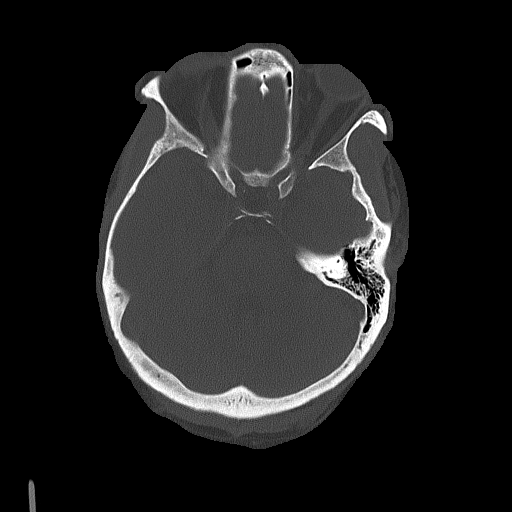
[im 25/81  bone]
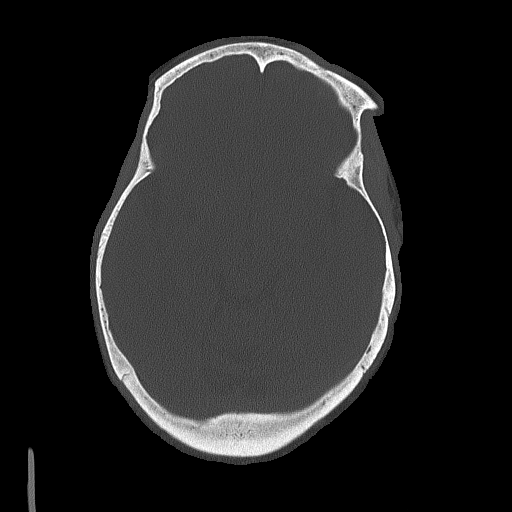
[im 37/81  bone]
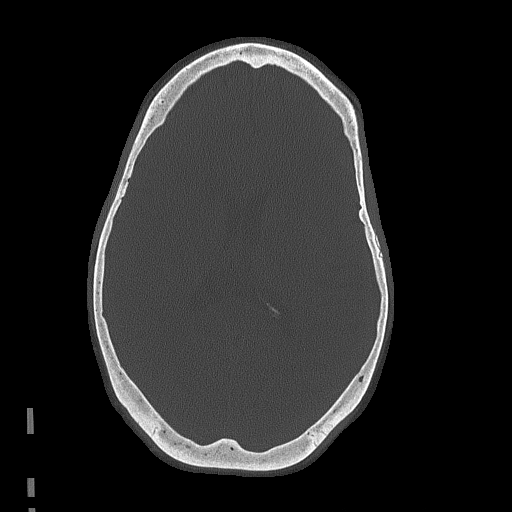

[Series 4: coronal soft tissue · coronal · 0.31mm/px · 3 of 68 slices shown]
[im 23/68  brain]
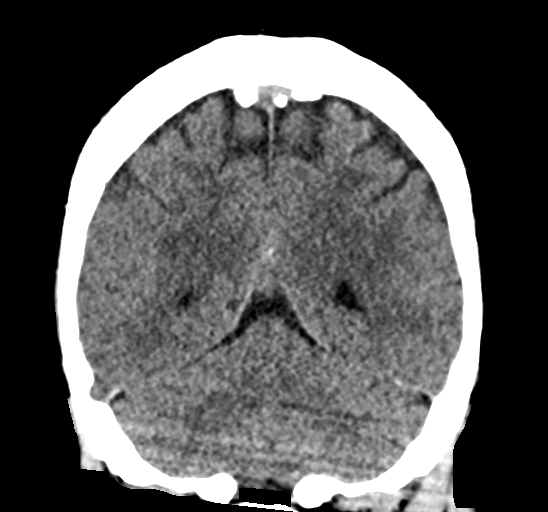
[im 30/68  brain]
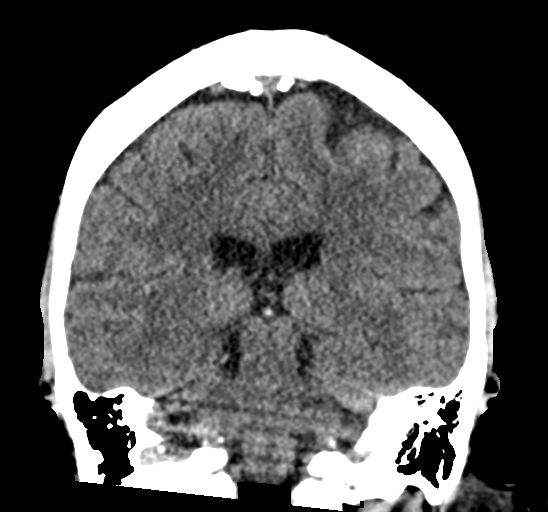
[im 38/68  brain]
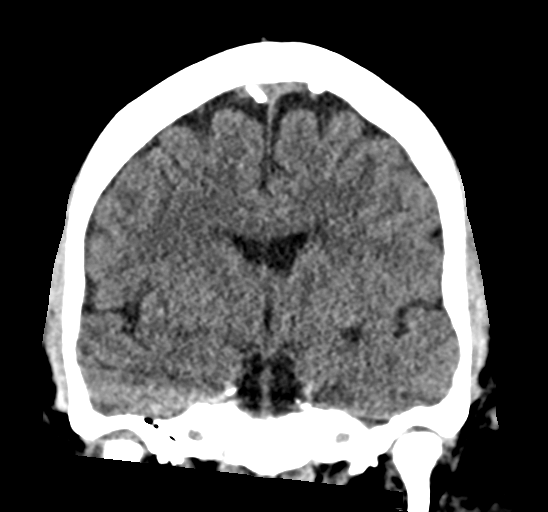

[Series 5: sagittal soft tissue · sagittal · 0.34mm/px · 3 of 51 slices shown]
[im 17/51  brain]
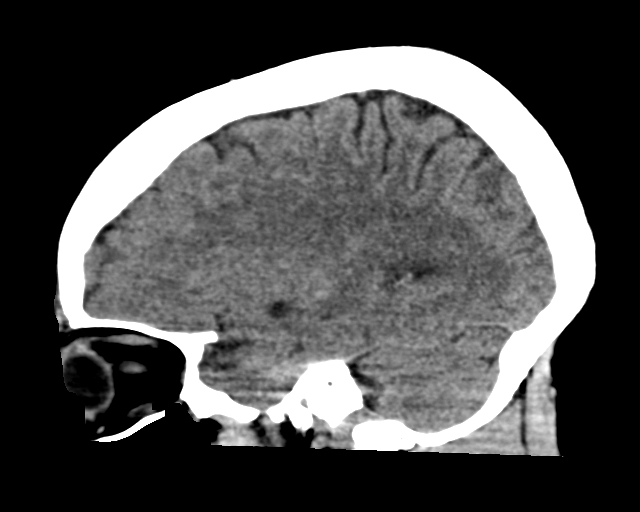
[im 26/51  brain]
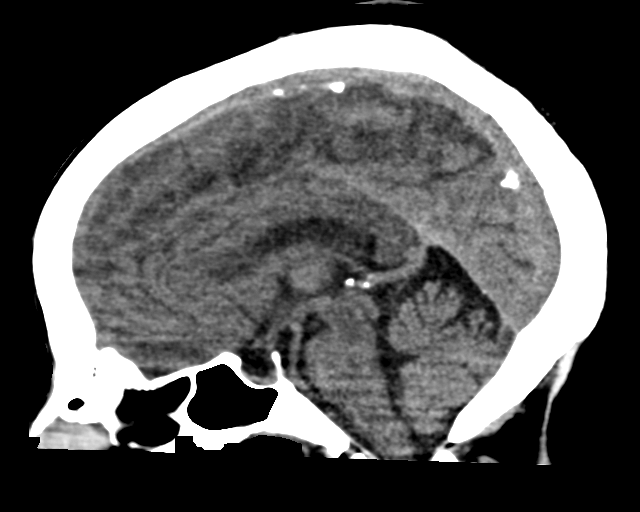
[im 34/51  brain]
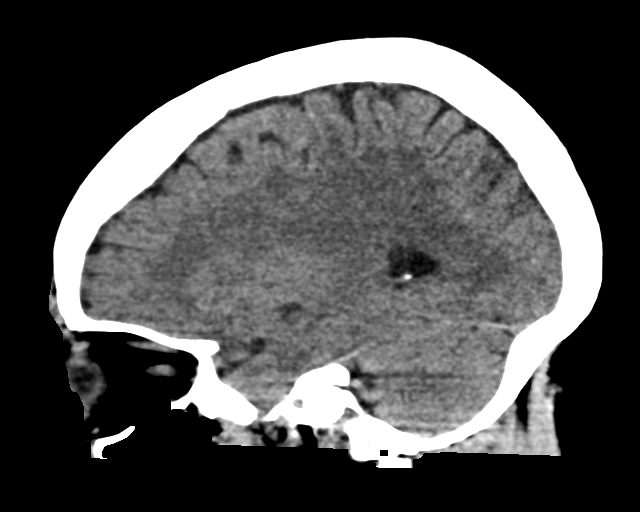

[17 of 47 positions shown; findings below may reference images not displayed]

FINDINGS: Brain: No acute intracranial abnormality. Specifically, no
hemorrhage, hydrocephalus, mass lesion, acute infarction, or
significant intracranial injury.

Vascular: No hyperdense vessel or unexpected calcification.

Skull: No acute calvarial abnormality.

Sinuses/Orbits: No acute findings

Other: None
IMPRESSION: No acute intracranial abnormality.
